# Patient Record
Sex: Female | Born: 1950 | Race: White | Hispanic: No | Marital: Married | Smoking: Never smoker
Health system: Southern US, Community
[De-identification: ages and names within clinical notes are randomized; demographics above are authoritative.]

## PROBLEM LIST (undated history)

## (undated) DIAGNOSIS — M1990 Unspecified osteoarthritis, unspecified site: Secondary | ICD-10-CM

## (undated) DIAGNOSIS — R030 Elevated blood-pressure reading, without diagnosis of hypertension: Secondary | ICD-10-CM

## (undated) HISTORY — DX: Elevated blood-pressure reading, without diagnosis of hypertension: R03.0

## (undated) HISTORY — DX: Unspecified osteoarthritis, unspecified site: M19.90

## (undated) HISTORY — PX: OTHER SURGICAL HISTORY: SHX169

---

## 1998-04-30 ENCOUNTER — Other Ambulatory Visit: Admission: RE | Admit: 1998-04-30 | Discharge: 1998-04-30 | Payer: Self-pay | Admitting: Obstetrics & Gynecology

## 2000-04-19 ENCOUNTER — Other Ambulatory Visit: Admission: RE | Admit: 2000-04-19 | Discharge: 2000-04-19 | Payer: Self-pay | Admitting: Obstetrics & Gynecology

## 2002-08-29 ENCOUNTER — Other Ambulatory Visit: Admission: RE | Admit: 2002-08-29 | Discharge: 2002-08-29 | Payer: Self-pay | Admitting: Gynecology

## 2003-11-26 ENCOUNTER — Other Ambulatory Visit: Admission: RE | Admit: 2003-11-26 | Discharge: 2003-11-26 | Payer: Self-pay | Admitting: Obstetrics & Gynecology

## 2004-08-13 ENCOUNTER — Ambulatory Visit (HOSPITAL_COMMUNITY): Admission: RE | Admit: 2004-08-13 | Discharge: 2004-08-13 | Payer: Self-pay | Admitting: Obstetrics & Gynecology

## 2005-06-02 ENCOUNTER — Other Ambulatory Visit: Admission: RE | Admit: 2005-06-02 | Discharge: 2005-06-02 | Payer: Self-pay | Admitting: Obstetrics & Gynecology

## 2009-03-21 ENCOUNTER — Encounter: Admission: RE | Admit: 2009-03-21 | Discharge: 2009-03-21 | Payer: Self-pay | Admitting: Obstetrics & Gynecology

## 2016-02-19 ENCOUNTER — Other Ambulatory Visit: Payer: Self-pay | Admitting: Obstetrics & Gynecology

## 2016-02-19 DIAGNOSIS — R928 Other abnormal and inconclusive findings on diagnostic imaging of breast: Secondary | ICD-10-CM

## 2016-02-26 ENCOUNTER — Ambulatory Visit
Admission: RE | Admit: 2016-02-26 | Discharge: 2016-02-26 | Disposition: A | Discharge status: Home / Self Care | Payer: BLUE CROSS/BLUE SHIELD | Source: Ambulatory Visit | Attending: Obstetrics & Gynecology | Admitting: Obstetrics & Gynecology

## 2016-02-26 DIAGNOSIS — R928 Other abnormal and inconclusive findings on diagnostic imaging of breast: Secondary | ICD-10-CM

## 2016-07-24 ENCOUNTER — Other Ambulatory Visit: Payer: Self-pay | Admitting: Obstetrics & Gynecology

## 2016-07-24 DIAGNOSIS — N6001 Solitary cyst of right breast: Secondary | ICD-10-CM

## 2016-08-27 ENCOUNTER — Ambulatory Visit
Admission: RE | Admit: 2016-08-27 | Discharge: 2016-08-27 | Disposition: A | Discharge status: Home / Self Care | Payer: BLUE CROSS/BLUE SHIELD | Source: Ambulatory Visit | Attending: Obstetrics & Gynecology | Admitting: Obstetrics & Gynecology

## 2016-08-27 DIAGNOSIS — N6001 Solitary cyst of right breast: Secondary | ICD-10-CM

## 2016-09-23 ENCOUNTER — Other Ambulatory Visit: Payer: Self-pay | Admitting: Internal Medicine

## 2016-09-23 ENCOUNTER — Ambulatory Visit
Admission: RE | Admit: 2016-09-23 | Discharge: 2016-09-23 | Disposition: A | Discharge status: Home / Self Care | Payer: BLUE CROSS/BLUE SHIELD | Source: Ambulatory Visit | Attending: Internal Medicine | Admitting: Internal Medicine

## 2016-09-23 DIAGNOSIS — R05 Cough: Secondary | ICD-10-CM

## 2016-09-23 DIAGNOSIS — R059 Cough, unspecified: Secondary | ICD-10-CM

## 2017-02-12 ENCOUNTER — Other Ambulatory Visit: Payer: Self-pay | Admitting: Obstetrics & Gynecology

## 2017-02-12 DIAGNOSIS — N63 Unspecified lump in unspecified breast: Secondary | ICD-10-CM

## 2017-02-15 ENCOUNTER — Other Ambulatory Visit: Payer: Self-pay | Admitting: Obstetrics & Gynecology

## 2017-02-15 DIAGNOSIS — E2839 Other primary ovarian failure: Secondary | ICD-10-CM

## 2017-03-10 ENCOUNTER — Ambulatory Visit
Admission: RE | Admit: 2017-03-10 | Discharge: 2017-03-10 | Disposition: A | Discharge status: Home / Self Care | Payer: BLUE CROSS/BLUE SHIELD | Source: Ambulatory Visit | Attending: Obstetrics & Gynecology | Admitting: Obstetrics & Gynecology

## 2017-03-10 ENCOUNTER — Encounter: Payer: Self-pay | Admitting: Radiology

## 2017-03-10 DIAGNOSIS — N63 Unspecified lump in unspecified breast: Secondary | ICD-10-CM

## 2017-03-10 DIAGNOSIS — E2839 Other primary ovarian failure: Secondary | ICD-10-CM

## 2017-11-11 ENCOUNTER — Encounter (HOSPITAL_COMMUNITY): Payer: Self-pay | Admitting: Emergency Medicine

## 2017-11-11 ENCOUNTER — Emergency Department (HOSPITAL_COMMUNITY): Payer: Medicare Other

## 2017-11-11 ENCOUNTER — Other Ambulatory Visit: Payer: Self-pay

## 2017-11-11 ENCOUNTER — Emergency Department (HOSPITAL_COMMUNITY)
Admission: EM | Admit: 2017-11-11 | Discharge: 2017-11-11 | Disposition: A | Discharge status: Home / Self Care | Payer: Medicare Other | Attending: Emergency Medicine | Admitting: Emergency Medicine

## 2017-11-11 DIAGNOSIS — R0789 Other chest pain: Secondary | ICD-10-CM | POA: Diagnosis not present

## 2017-11-11 DIAGNOSIS — R05 Cough: Secondary | ICD-10-CM | POA: Insufficient documentation

## 2017-11-11 DIAGNOSIS — Z8249 Family history of ischemic heart disease and other diseases of the circulatory system: Secondary | ICD-10-CM | POA: Diagnosis not present

## 2017-11-11 DIAGNOSIS — R079 Chest pain, unspecified: Secondary | ICD-10-CM

## 2017-11-11 LAB — BASIC METABOLIC PANEL
Anion gap: 12 (ref 5–15)
BUN: 12 mg/dL (ref 6–20)
CO2: 21 mmol/L — ABNORMAL LOW (ref 22–32)
Calcium: 9.1 mg/dL (ref 8.9–10.3)
Chloride: 108 mmol/L (ref 101–111)
Creatinine, Ser: 0.66 mg/dL (ref 0.44–1.00)
GFR calc Af Amer: >60 mL/min (ref >60)
GFR calc non Af Amer: >60 mL/min (ref >60)
Glucose, Bld: 125 mg/dL — ABNORMAL HIGH (ref 65–99)
Potassium: 4 mmol/L (ref 3.5–5.1)
Sodium: 141 mmol/L (ref 135–145)

## 2017-11-11 LAB — CBC
HCT: 39 % (ref 36.0–46.0)
Hemoglobin: 13 g/dL (ref 12.0–15.0)
MCH: 29.3 pg (ref 26.0–34.0)
MCHC: 33.3 g/dL (ref 30.0–36.0)
MCV: 88 fL (ref 78.0–100.0)
Platelets: 293 10*3/uL (ref 150–400)
RBC: 4.43 MIL/uL (ref 3.87–5.11)
RDW: 12.8 % (ref 11.5–15.5)
WBC: 8 10*3/uL (ref 4.0–10.5)

## 2017-11-11 LAB — I-STAT TROPONIN, ED
Troponin i, poc: 0 ng/mL (ref 0.00–0.08)
Troponin i, poc: 0 ng/mL (ref 0.00–0.08)

## 2017-11-11 LAB — D-DIMER, QUANTITATIVE: D-Dimer, Quant: <0.27 ug/mL-FEU (ref 0.00–0.50)

## 2017-11-11 NOTE — ED Provider Notes (Signed)
[facility] EMERGENCY DEPARTMENT Provider Note   CSN: [id number] Arrival date & time: 11/11/17  0750     History   Chief Complaint Chief Complaint  Patient presents with  . Chest Pain    HPI Melody Wilkins is a 67 y.o. female.  HPI   Had increased blood pressures, saw PCP yesterday regarding this and was planning on checks of blood pressure and follow up Sharp pains under right breast has been going on for weeks, sometimes radiates around to the right back, has noticed it with arm movements, sometimes turning or driving. Comes and goes.   Dull aching pain on left side of chest, off and on at night for a few weeks Initially had pain on arrival but not now.  Nothing makes left sided pain better or worse. When asked about walking making it worse, reports yes.  No shortness of breath, no nausea, no diaphoresis, no lightheadedness 2 nights ago had nausea but not correlated with pain No recent lifting If walking a lot will have leg swelling, noted last year was asymmetric but none recently. No recent trips or surgery Has had a cough for a while, was trying flonase, thought was post-nasal, chronic for years.  No hx of medical problems, dad had CHF in 44s  Had htn newly seen this week, but physician didn't want to start meds yet, wanted to monitor and decide.    History reviewed. No pertinent past medical history.  There are no active problems to display for this patient.   History reviewed. No pertinent surgical history.  OB History    No data available       Home Medications    Prior to Admission medications   Medication Sig Start Date End Date Taking? Authorizing Provider  ibuprofen (ADVIL,MOTRIN) 200 MG tablet Take 200 mg by mouth every 6 (six) hours as needed for moderate pain.   Yes [provider]  Multiple Vitamin (MULTIVITAMIN) tablet Take 1 tablet by mouth daily.   Yes [provider]    Family History No family history  on file.  Social History Social History   Tobacco Use  . Smoking status: Never Smoker  . Smokeless tobacco: Never Used  Substance Use Topics  . Alcohol use: Yes    Comment: occass  . Drug use: No     Allergies   Penicillins and Amoxicillin   Review of Systems Review of Systems  Constitutional: Negative for fever.  HENT: Negative for sore throat.   Eyes: Negative for visual disturbance.  Respiratory: Positive for cough. Negative for shortness of breath.   Cardiovascular: Positive for chest pain. Negative for leg swelling.  Gastrointestinal: Negative for abdominal pain, nausea and vomiting.  Genitourinary: Negative for difficulty urinating.  Musculoskeletal: Negative for back pain and neck pain.  Skin: Negative for rash.  Neurological: Negative for syncope and headaches.     Physical Exam Updated Vital Signs BP (!) 176/90 (BP Location: Right Arm)   Pulse (!) 101   Temp 98.3 F (36.8 C) (Oral)   Resp 16   Ht 5\' 8"  (1.727 m)   Wt 78.9 kg (174 lb)   SpO2 97%   BMI 26.46 kg/m   Physical Exam  Constitutional: She is oriented to person, place, and time. She appears well-developed and well-nourished. No distress.  HENT:  Head: Normocephalic and atraumatic.  Eyes: Conjunctivae and EOM are normal.  Neck: Normal range of motion.  Cardiovascular: Normal rate, regular rhythm, normal heart sounds and  intact distal pulses. Exam reveals no gallop and no friction rub.  No murmur heard. Pulmonary/Chest: Effort normal and breath sounds normal. No respiratory distress. She has no wheezes. She has no rales.  Abdominal: Soft. She exhibits no distension. There is no tenderness. There is no guarding.  Musculoskeletal: She exhibits no edema or tenderness.  Neurological: She is alert and oriented to person, place, and time.  Skin: Skin is warm and dry. No rash noted. She is not diaphoretic. No erythema.  Nursing note and vitals reviewed.    ED Treatments / Results  Labs (all  labs ordered are listed, but only abnormal results are displayed) Labs Reviewed  BASIC METABOLIC PANEL - Abnormal; Notable for the following components:      Result Value   CO2 21 (*)    Glucose, Bld 125 (*)    All other components within normal limits  CBC  D-DIMER, QUANTITATIVE (NOT AT [facility])  I-STAT TROPONIN, ED    EKG  EKG Interpretation  Date/Time:  Thursday November 11 2017 09:17:59 EST Ventricular Rate:  92 PR Interval:  144 QRS Duration: 99 QT Interval:  408 QTC Calculation: 505 R Axis:   -32 Text Interpretation:  Sinus rhythm Abnormal R-wave progression, early transition Left ventricular hypertrophy Borderline T abnormalities, anterior leads Prolonged QT interval No significant change since last tracing Confirmed by Gareth Morgan [phone]) on 11/11/2017 10:40:36 AM       Radiology Dg Chest 2 View  Result Date: 11/11/2017 CLINICAL DATA:  Intermittent right-sided chest pain over the last week. Some dizziness and nausea. EXAM: CHEST  2 VIEW COMPARISON:  09/23/2016 FINDINGS: Heart size is normal. Mediastinal shadows are normal. The lungs are clear. No bronchial thickening. No infiltrate, mass, effusion or collapse. Pulmonary vascularity is normal. No bony abnormality. IMPRESSION: Normal chest Electronically Signed   By: Nelson Chimes M.D.   On: 11/11/2017 09:12    Procedures Procedures (including critical care time)  Medications Ordered in ED Medications - No data to display   Initial Impression / Assessment and Plan / ED Course  I have reviewed the triage vital signs and the nursing notes.  Pertinent labs & imaging results that were available during my care of the patient were reviewed by me and considered in my medical decision making (see chart for details).     67 year old female with no significant history, recent elevated blood pressure without diagnosis of htn yet, presents with concern of chest pain. Differential diagnosis for chest pain includes pulmonary  embolus, dissection, pneumothorax, pneumonia, ACS, myocarditis, pericarditis.  EKG was done and evaluate by me and showed nonspecific changes and no signs of pericarditis. Chest x-ray was done and evaluated by me and radiology and showed no sign of pneumonia or pneumothorax. Patient is low risk Wells, ddimer negative and have low suspicion for PE.  Patient is HEART score of 3 and had delta troponins which were both negative. Given this evaluation, history and physical have low suspicion for pulmonary embolus, pneumonia, ACS, myocarditis, pericarditis, dissection.   Recommend follow up as outpatient with PCP and Cardiology, consider outpatient stress testing.  Discussed reasons to return in detail.  Blood pressures improved in ED.  Patient discharged in stable condition with understanding of reasons to return.    Final Clinical Impressions(s) / ED Diagnoses   Final diagnoses:  Chest pain, unspecified type    ED Discharge Orders    None       Gareth Morgan, MD 11/11/17 1742

## 2017-11-11 NOTE — ED Triage Notes (Signed)
Pt reports intermittent R CP over past week, reports intermittent dizziness and nausea, denies SOB.  Pt describes pain as "sharp" under R breast, dull over L chest.  Resp e/u at this time.

## 2017-11-16 ENCOUNTER — Other Ambulatory Visit: Payer: Self-pay | Admitting: Internal Medicine

## 2017-11-16 DIAGNOSIS — R9431 Abnormal electrocardiogram [ECG] [EKG]: Secondary | ICD-10-CM

## 2017-11-19 ENCOUNTER — Other Ambulatory Visit: Payer: Self-pay

## 2017-11-19 ENCOUNTER — Ambulatory Visit (HOSPITAL_COMMUNITY): Payer: Medicare Other | Attending: Internal Medicine

## 2017-11-19 DIAGNOSIS — R9431 Abnormal electrocardiogram [ECG] [EKG]: Secondary | ICD-10-CM | POA: Diagnosis not present

## 2017-11-19 DIAGNOSIS — I10 Essential (primary) hypertension: Secondary | ICD-10-CM | POA: Diagnosis not present

## 2017-11-19 DIAGNOSIS — Z8249 Family history of ischemic heart disease and other diseases of the circulatory system: Secondary | ICD-10-CM | POA: Insufficient documentation

## 2017-12-16 ENCOUNTER — Encounter: Payer: Self-pay | Admitting: Internal Medicine

## 2017-12-17 ENCOUNTER — Ambulatory Visit: Payer: Medicare Other | Admitting: Internal Medicine

## 2017-12-17 ENCOUNTER — Encounter: Payer: Self-pay | Admitting: Internal Medicine

## 2017-12-17 VITALS — BP 138/84 | HR 81 | Ht 68.0 in | Wt 179.4 lb

## 2017-12-17 DIAGNOSIS — I10 Essential (primary) hypertension: Secondary | ICD-10-CM

## 2017-12-17 DIAGNOSIS — R9431 Abnormal electrocardiogram [ECG] [EKG]: Secondary | ICD-10-CM

## 2017-12-17 DIAGNOSIS — E785 Hyperlipidemia, unspecified: Secondary | ICD-10-CM | POA: Diagnosis not present

## 2017-12-17 DIAGNOSIS — R0789 Other chest pain: Secondary | ICD-10-CM

## 2017-12-17 NOTE — Patient Instructions (Signed)
Medication Instructions:  Your physician recommends that you continue on your current medications as directed. Please refer to the Current Medication list given to you today.  -- If you need a refill on your cardiac medications before your next appointment, please call your pharmacy. --  Labwork: None ordered  Testing/Procedures: Your physician has requested that you have a stress echocardiogram. For further information please visit [email or web address]. Please follow instruction sheet as given.    Follow-Up: Your physician wants you to follow-up as needed with Dr. Saunders Revel.    Thank you for choosing [facility]!!    Any Other Special Instructions Will Be Listed Below (If Applicable).    DASH Eating Plan DASH stands for "Dietary Approaches to Stop Hypertension." The DASH eating plan is a healthy eating plan that has been shown to reduce high blood pressure (hypertension). It may also reduce your risk for type 2 diabetes, heart disease, and stroke. The DASH eating plan may also help with weight loss. What are tips for following this plan? General guidelines  Avoid eating more than 2,300 mg (milligrams) of salt (sodium) a day. If you have hypertension, you may need to reduce your sodium intake to 1,500 mg a day.  Limit alcohol intake to no more than 1 drink a day for nonpregnant women and 2 drinks a day for men. One drink equals 12 oz of beer, 5 oz of wine, or 1 oz of hard liquor.  Work with your health care provider to maintain a healthy body weight or to lose weight. Ask what an ideal weight is for you.  Get at least 30 minutes of exercise that causes your heart to beat faster (aerobic exercise) most days of the week. Activities may include walking, swimming, or biking.  Work with your health care provider or diet and nutrition specialist (dietitian) to adjust your eating plan to your individual calorie needs. Reading food labels  Check food labels for the amount of sodium per  serving. Choose foods with less than 5 percent of the Daily Value of sodium. Generally, foods with less than 300 mg of sodium per serving fit into this eating plan.  To find whole grains, look for the word "whole" as the first word in the ingredient list. Shopping  Buy products labeled as "low-sodium" or "no salt added."  Buy fresh foods. Avoid canned foods and premade or frozen meals. Cooking  Avoid adding salt when cooking. Use salt-free seasonings or herbs instead of table salt or sea salt. Check with your health care provider or pharmacist before using salt substitutes.  Do not fry foods. Cook foods using healthy methods such as baking, boiling, grilling, and broiling instead.  Cook with heart-healthy oils, such as olive, canola, soybean, or sunflower oil. Meal planning   Eat a balanced diet that includes: ? 5 or more servings of fruits and vegetables each day. At each meal, try to fill half of your plate with fruits and vegetables. ? Up to 6-8 servings of whole grains each day. ? Less than 6 oz of lean meat, poultry, or fish each day. A 3-oz serving of meat is about the same size as a deck of cards. One egg equals 1 oz. ? 2 servings of low-fat dairy each day. ? A serving of nuts, seeds, or beans 5 times each week. ? Heart-healthy fats. Healthy fats called Omega-3 fatty acids are found in foods such as flaxseeds and coldwater fish, like sardines, salmon, and mackerel.  Limit how much you eat of the  following: ? Canned or prepackaged foods. ? Food that is high in trans fat, such as fried foods. ? Food that is high in saturated fat, such as fatty meat. ? Sweets, desserts, sugary drinks, and other foods with added sugar. ? Full-fat dairy products.  Do not salt foods before eating.  Try to eat at least 2 vegetarian meals each week.  Eat more home-cooked food and less restaurant, buffet, and fast food.  When eating at a restaurant, ask that your food be prepared with less salt  or no salt, if possible. What foods are recommended? The items listed may not be a complete list. Talk with your dietitian about what dietary choices are best for you. Grains Whole-grain or whole-wheat bread. Whole-grain or whole-wheat pasta. Brown rice. [place] Morrow. Bulgur. Whole-grain and low-sodium cereals. Pita bread. Low-fat, low-sodium crackers. Whole-wheat flour tortillas. Vegetables Fresh or frozen vegetables (raw, steamed, roasted, or grilled). Low-sodium or reduced-sodium tomato and vegetable juice. Low-sodium or reduced-sodium tomato sauce and tomato paste. Low-sodium or reduced-sodium canned vegetables. Fruits All fresh, dried, or frozen fruit. Canned fruit in natural juice (without added sugar). Meat and other protein foods Skinless chicken or [place]. Ground chicken or [place]. Pork with fat trimmed off. Fish and seafood. Egg whites. Dried beans, peas, or lentils. Unsalted nuts, nut butters, and seeds. Unsalted canned beans. Lean cuts of beef with fat trimmed off. Low-sodium, lean deli meat. Dairy Low-fat (1%) or fat-free (skim) milk. Fat-free, low-fat, or reduced-fat cheeses. Nonfat, low-sodium ricotta or cottage cheese. Low-fat or nonfat yogurt. Low-fat, low-sodium cheese. Fats and oils Soft margarine without trans fats. Vegetable oil. Low-fat, reduced-fat, or light mayonnaise and salad dressings (reduced-sodium). Canola, safflower, olive, soybean, and sunflower oils. Avocado. Seasoning and other foods Herbs. Spices. Seasoning mixes without salt. Unsalted popcorn and pretzels. Fat-free sweets. What foods are not recommended? The items listed may not be a complete list. Talk with your dietitian about what dietary choices are best for you. Grains Baked goods made with fat, such as croissants, muffins, or some breads. Dry pasta or rice meal packs. Vegetables Creamed or fried vegetables. Vegetables in a cheese sauce. Regular canned vegetables (not low-sodium or reduced-sodium).  Regular canned tomato sauce and paste (not low-sodium or reduced-sodium). Regular tomato and vegetable juice (not low-sodium or reduced-sodium). Angie Fava. Olives. Fruits Canned fruit in a light or heavy syrup. Fried fruit. Fruit in cream or butter sauce. Meat and other protein foods Fatty cuts of meat. Ribs. Fried meat. Berniece Salines. Sausage. Bologna and other processed lunch meats. Salami. Fatback. Hotdogs. Bratwurst. Salted nuts and seeds. Canned beans with added salt. Canned or smoked fish. Whole eggs or egg yolks. Chicken or [place] with skin. Dairy Whole or 2% milk, cream, and half-and-half. Whole or full-fat cream cheese. Whole-fat or sweetened yogurt. Full-fat cheese. Nondairy creamers. Whipped toppings. Processed cheese and cheese spreads. Fats and oils Butter. Stick margarine. Lard. Shortening. Ghee. Bacon fat. Tropical oils, such as coconut, palm kernel, or palm oil. Seasoning and other foods Salted popcorn and pretzels. Onion salt, garlic salt, seasoned salt, table salt, and sea salt. Worcestershire sauce. Tartar sauce. Barbecue sauce. Teriyaki sauce. Soy sauce, including reduced-sodium. Steak sauce. Canned and packaged gravies. Fish sauce. Oyster sauce. Cocktail sauce. Horseradish that you find on the shelf. Ketchup. Mustard. Meat flavorings and tenderizers. Bouillon cubes. Hot sauce and Tabasco sauce. Premade or packaged marinades. Premade or packaged taco seasonings. Relishes. Regular salad dressings. Where to find more information:  National Heart, Lung, and [place]: https://wilson-eaton.com/  American Heart Association: www.heart.org  Summary  The DASH eating plan is a healthy eating plan that has been shown to reduce high blood pressure (hypertension). It may also reduce your risk for type 2 diabetes, heart disease, and stroke.  With the DASH eating plan, you should limit salt (sodium) intake to 2,300 mg a day. If you have hypertension, you may need to reduce your sodium intake to 1,500 mg  a day.  When on the DASH eating plan, aim to eat more fresh fruits and vegetables, whole grains, lean proteins, low-fat dairy, and heart-healthy fats.  Work with your health care provider or diet and nutrition specialist (dietitian) to adjust your eating plan to your individual calorie needs. This information is not intended to replace advice given to you by your health care provider. Make sure you discuss any questions you have with your health care provider. Document Released: 10/01/2011 Document Revised: 10/05/2016 Document Reviewed: 10/05/2016 Elsevier Interactive Patient Education  [employer].

## 2017-12-17 NOTE — Progress Notes (Signed)
New Outpatient Visit Date: 12/17/2017  Referring Provider: Lavone Orn, MD [address] [place] [address]  Chief Complaint: Chest pain  HPI:  Melody Wilkins is a 67 y.o. female who is being seen today for the evaluation of chest pain following ED visit last month at the request of Dr. Laurann Montana. She has a history of elevated blood pressure (though she does not carry the diagnosis of hypertension) and hyperlipidemia. She presented to the ED on 11/11/17 with pain rating from the left shoulder to the center of her chest. She notes that she had received the pneumonia vaccine in her left upper arm the previous day and had been sleeping on her left arm that night. She described the pain as an ache, which resolved over the course of the day. There were no accompanying symptoms. She also noted occasional brief sharp chest pains when turning and reaching behind her. The pain would last only a few seconds while she was doing the movement. EKG showed sinus rhythm with LVH and nonspecific ST/T changes. Labs, including troponin 2, were unrevealing.  Today, Melody Wilkins reports feeling well. She went for a long walk yesterday (45 minutes) to see if this would bring on any chest discomfort. It did not. She denies a history of prior heart problems. She denies shortness of breath, palpitations, lightheadedness, edema, orthopnea, and PND. She was referred for an echocardiogram by Dr. Laurann Montana last month, which was normal. She is never undergone ischemia testing.  --------------------------------------------------------------------------------------------------  Cardiovascular History & Procedures: Cardiovascular Problems:  Atypical chest pain  Risk Factors:  Elevated blood pressure, hyperlipidemia, and age greater than 5  Cath/PCI:  None  CV Surgery:  None  EP Procedures and Devices:  None  Non-Invasive Evaluation(s):  None  Recent CV Pertinent Labs: Lab Results  Component  Value Date   K 4.0 11/11/2017   BUN 12 11/11/2017   CREATININE 0.66 11/11/2017    --------------------------------------------------------------------------------------------------  Past Medical History:  Diagnosis Date  . DJD (degenerative joint disease)    RIGHT ANKLE  . Elevated blood pressure reading     Past Surgical History:  Procedure Laterality Date  . ANKLE BONE SPUR Right   . CESAREAN SECTION     x2    Current Meds  Medication Sig  . ibuprofen (ADVIL,MOTRIN) 200 MG tablet Take 200 mg by mouth every 6 (six) hours as needed for moderate pain.  . Multiple Vitamin (MULTIVITAMIN) tablet Take 1 tablet by mouth daily.    Allergies: Penicillins; Omeprazole; and Amoxicillin  Social History   Socioeconomic History  . Marital status: Married    Spouse name: Not on file  . Number of children: 2  . Years of education: Not on file  . Highest education level: Not on file  Social Needs  . Financial resource strain: Not on file  . Food insecurity - worry: Not on file  . Food insecurity - inability: Not on file  . Transportation needs - medical: Not on file  . Transportation needs - non-medical: Not on file  Occupational History  . Occupation: RETIRED  Tobacco Use  . Smoking status: Never Smoker  . Smokeless tobacco: Never Used  Substance and Sexual Activity  . Alcohol use: Yes    Alcohol/week: 0.6 oz    Types: 1 Glasses of wine per week  . Drug use: No  . Sexual activity: Not on file  Other Topics Concern  . Not on file  Social History Narrative  . Not on  file    Family History  Problem Relation Age of Onset  . Ovarian cancer Mother 66  . Hypertension Father 24  . Congestive Heart Failure Father   . Kidney failure Father   . Liver disease Father     Review of Systems: A 12-system review of systems was performed and was negative except as noted in the  HPI.  --------------------------------------------------------------------------------------------------  Physical Exam: BP 138/84   Pulse 81   Ht 5\' 8"  (1.727 m)   Wt 179 lb 6.4 oz (81.4 kg)   SpO2 96%   BMI 27.28 kg/m   General:  Overweight woman, seated comfortably in the exam room. HEENT: No conjunctival pallor or scleral icterus. Moist mucous membranes. OP clear. Neck: Supple without lymphadenopathy, thyromegaly, JVD, or HJR. No carotid bruit. Lungs: Normal work of breathing. Clear to auscultation bilaterally without wheezes or crackles. Heart: Regular rate and rhythm without murmurs, rubs, or gallops. Non-displaced PMI. Abd: Bowel sounds present. Soft, NT/ND without hepatosplenomegaly Ext: No lower extremity edema. Radial, PT, and DP pulses are 2+ bilaterally Skin: Warm and Wilkins without rash. Neuro: CNIII-XII intact. Strength and fine-touch sensation intact in upper and lower extremities bilaterally. Psych: Normal mood and affect.  EKG:  Normal sinus rhythm, left axis deviation, and nonspecific ST changes.  Lab Results  Component Value Date   WBC 8.0 11/11/2017   HGB 13.0 11/11/2017   HCT 39.0 11/11/2017   MCV 88.0 11/11/2017   PLT 293 11/11/2017    Lab Results  Component Value Date   NA 141 11/11/2017   K 4.0 11/11/2017   CL 108 11/11/2017   CO2 21 (L) 11/11/2017   BUN 12 11/11/2017   CREATININE 0.66 11/11/2017   GLUCOSE 125 (H) 11/11/2017    No results found for: CHOL, HDL, LDLCALC, LDLDIRECT, TRIG, CHOLHDL   --------------------------------------------------------------------------------------------------  ASSESSMENT AND PLAN: Atypical chest pain and abnormal EKG The character of Melody Wilkins pain is most consistent with musculoskeletal etiology. Limited workup in the ED last month was unrevealing. Echocardiogram was also normal. Her EKG today demonstrates nonspecific ST segment changes. Cardiac risk factors include hyperlipidemia, hypertension (multiple  blood pressure readings over the last few months have been elevated), and age. We have discussed further assessment options and have agreed to perform an exercise stress echocardiogram. If this is normal, further cardiac testing can be deferred and primary prevention continued.  Hypertension Blood pressure borderline elevated today. Prior readings at home as well as with Dr. Laurann Montana and Ms. Salmela' gynecologist, have been elevated. We discussed lifestyle modifications for treatment of her hypertension, including weight loss and sodium restriction. I have provided her with information about the DASH diet. I will defer continuing management to Dr. Laurann Montana.  Hyperlipidemia Lipid panel last month was notable for LDL of 150. We discussed lifestyle modification to help get her LDL less than 1:30. If there is evidence of CAD on aforementioned testing, statin therapy will need to be considered.  Follow-up: To be determined based on results of stress echocardiogram. If this study is normal, Melody Wilkins can follow-up on an as-needed basis.  Nelva Bush, MD 12/18/2017 1:32 PM

## 2017-12-18 ENCOUNTER — Encounter: Payer: Self-pay | Admitting: Internal Medicine

## 2017-12-18 DIAGNOSIS — R9431 Abnormal electrocardiogram [ECG] [EKG]: Secondary | ICD-10-CM | POA: Insufficient documentation

## 2017-12-18 DIAGNOSIS — I10 Essential (primary) hypertension: Secondary | ICD-10-CM | POA: Insufficient documentation

## 2017-12-18 DIAGNOSIS — E782 Mixed hyperlipidemia: Secondary | ICD-10-CM | POA: Insufficient documentation

## 2017-12-18 DIAGNOSIS — R0789 Other chest pain: Secondary | ICD-10-CM | POA: Insufficient documentation

## 2017-12-23 ENCOUNTER — Telehealth (HOSPITAL_COMMUNITY): Payer: Self-pay | Admitting: *Deleted

## 2017-12-23 NOTE — Telephone Encounter (Signed)
Patient given detailed instructions per Stress Test Requisition Sheet for test on 12/30/17 at 2:30.Patient Notified to arrive 30 minutes early, and that it is imperative to arrive on time for appointment to keep from having the test rescheduled.  Patient verbalized understanding. Veronia Beets

## 2017-12-30 ENCOUNTER — Ambulatory Visit (HOSPITAL_COMMUNITY): Payer: Medicare Other

## 2017-12-30 ENCOUNTER — Ambulatory Visit (HOSPITAL_COMMUNITY): Payer: Medicare Other | Attending: Cardiology

## 2017-12-30 DIAGNOSIS — I10 Essential (primary) hypertension: Secondary | ICD-10-CM | POA: Insufficient documentation

## 2017-12-30 DIAGNOSIS — Z8249 Family history of ischemic heart disease and other diseases of the circulatory system: Secondary | ICD-10-CM | POA: Insufficient documentation

## 2017-12-30 DIAGNOSIS — R0600 Dyspnea, unspecified: Secondary | ICD-10-CM | POA: Insufficient documentation

## 2017-12-30 DIAGNOSIS — R0789 Other chest pain: Secondary | ICD-10-CM | POA: Diagnosis not present

## 2017-12-31 ENCOUNTER — Telehealth: Payer: Self-pay

## 2017-12-31 NOTE — Telephone Encounter (Signed)
Spoke with patient and she is able to come to an appointment at the end of the month with Melody Wilkins

## 2018-01-19 NOTE — Progress Notes (Signed)
Cardiology Office Note    Date:  01/20/2018   ID:  Melody, Wilkins 1951-01-28, MRN [id number]  PCP:  Lavone Orn, MD  Cardiologist:  Dr. Saunders Revel   Chief Complaint:  Stress echo follow up  History of Present Illness:   Melody Wilkins is a 67 y.o. female with hx of elevated blood pressure without dx of HTN and HLD presents for follow up.   Recently seen by Dr. Saunders Revel for chest pain after ER visit. EKG non specific changes. Felt atypical chest pain with mostly consistent with MSK etiology. Following exercise stress echo was normal except hypertensive response.   Here today for follow up. No recurrent chest pain. BP elevated today. She is initially reluctant to start medications but finally aggred. She has started walking exercise without any chest pain or sob. The patient denies nausea, vomiting, fever, chest pain, palpitations, shortness of breath, orthopnea, PND, dizziness, syncope, cough, congestion, abdominal pain, hematochezia, melena, lower extremity edema.   Past Medical History:  Diagnosis Date  . DJD (degenerative joint disease)    RIGHT ANKLE  . Elevated blood pressure reading     Past Surgical History:  Procedure Laterality Date  . ANKLE BONE SPUR Right   . CESAREAN SECTION     x2    Current Medications: Prior to Admission medications   Medication Sig Start Date End Date Taking? Authorizing Provider  ibuprofen (ADVIL,MOTRIN) 200 MG tablet Take 200 mg by mouth every 6 (six) hours as needed for moderate pain.    [provider]  Multiple Vitamin (MULTIVITAMIN) tablet Take 1 tablet by mouth daily.    [provider]    Allergies:   Penicillins; Omeprazole; and Amoxicillin   Social History   Socioeconomic History  . Marital status: Married    Spouse name: Not on file  . Number of children: 2  . Years of education: Not on file  . Highest education level: Not on file  Occupational History  . Occupation: RETIRED  Social Needs  . Financial  resource strain: Not on file  . Food insecurity:    Worry: Not on file    Inability: Not on file  . Transportation needs:    Medical: Not on file    Non-medical: Not on file  Tobacco Use  . Smoking status: Never Smoker  . Smokeless tobacco: Never Used  Substance and Sexual Activity  . Alcohol use: Yes    Alcohol/week: 0.6 oz    Types: 1 Glasses of wine per week  . Drug use: No  . Sexual activity: Not on file  Lifestyle  . Physical activity:    Days per week: Not on file    Minutes per session: Not on file  . Stress: Not on file  Relationships  . Social connections:    Talks on phone: Not on file    Gets together: Not on file    Attends religious service: Not on file    Active member of club or organization: Not on file    Attends meetings of clubs or organizations: Not on file    Relationship status: Not on file  Other Topics Concern  . Not on file  Social History Narrative  . Not on file     Family History:  The patient's family history includes Congestive Heart Failure in her father; Hypertension (age of onset: 64) in her father; Kidney failure in her father; Liver disease in her father; Ovarian cancer (age of onset: 85) in her  mother.   ROS:   Please see the history of present illness.    ROS All other systems reviewed and are negative.   PHYSICAL EXAM:   VS:  BP (!) 148/92   Pulse 93   Ht 5\' 8"  (1.727 m)   Wt 178 lb (80.7 kg)   SpO2 95%   BMI 27.06 kg/m    GEN: Well nourished, well developed, in no acute distress  HEENT: normal  Neck: no JVD, carotid bruits, or masses Cardiac: RRR; no murmurs, rubs, or gallops,no edema  Respiratory:  clear to auscultation bilaterally, normal work of breathing GI: soft, nontender, nondistended, + BS MS: no deformity or atrophy  Skin: warm and dry, no rash Neuro:  Alert and Oriented x 3, Strength and sensation are intact Psych: euthymic mood, full affect  Wt Readings from Last 3 Encounters:  01/20/18 178 lb (80.7 kg)    12/17/17 179 lb 6.4 oz (81.4 kg)  11/11/17 174 lb (78.9 kg)      Studies/Labs Reviewed:   EKG:  EKG is not ordered today.    Recent Labs: 11/11/2017: BUN 12; Creatinine, Ser 0.66; Hemoglobin 13.0; Platelets 293; Potassium 4.0; Sodium 141   Lipid Panel No results found for: CHOL, TRIG, HDL, CHOLHDL, VLDL, LDLCALC, LDLDIRECT  Additional studies/ records that were reviewed today include:   Echocardiogram: 11/19/17 Study Conclusions  - Left ventricle: The cavity size was normal. Systolic function was   normal. The estimated ejection fraction was in the range of 60%   to 65%.  Stress echo 12/30/17 - Stress ECG conclusions: There were no stress arrhythmias or   conduction abnormalities. The stress ECG was negative for   ischemia. - Staged echo: There was no echocardiographic evidence for   stress-induced ischemia.   ASSESSMENT & PLAN:    1. Benign Essential Hypertension - Will start low dose coreg at 3.125mg  BID for elevated blood pressure and pulse in 90s. She will keep log of his blood pressure and give [place] a call in 2-3 weeks.   2. HLD - Not on any regimen. She is not interested in any medications. Will be managed by diet and exercise.   3. Atypical chest pain - No reoccurance  Medication Adjustments/Labs and Tests Ordered: Current medicines are reviewed at length with the patient today.  Concerns regarding medicines are outlined above.  Medication changes, Labs and Tests ordered today are listed in the Patient Instructions below. Patient Instructions  Your physician has recommended you make the following change in your medication:  START CARVEDILOL 3.125 MG TWICE DAILY   Your physician recommends that you schedule a follow-up appointment in:  [address] [facility]     Signed, Leanor Kail, PA  01/20/2018 9:50 AM    [address], [place], [place]  [address] Phone: [phone]; Fax: [phone]

## 2018-01-20 ENCOUNTER — Ambulatory Visit: Payer: Medicare Other | Admitting: Physician Assistant

## 2018-01-20 ENCOUNTER — Encounter: Payer: Self-pay | Admitting: Physician Assistant

## 2018-01-20 VITALS — BP 148/92 | HR 93 | Ht 68.0 in | Wt 178.0 lb

## 2018-01-20 DIAGNOSIS — R0789 Other chest pain: Secondary | ICD-10-CM | POA: Diagnosis not present

## 2018-01-20 DIAGNOSIS — E785 Hyperlipidemia, unspecified: Secondary | ICD-10-CM

## 2018-01-20 DIAGNOSIS — I10 Essential (primary) hypertension: Secondary | ICD-10-CM | POA: Diagnosis not present

## 2018-01-20 MED ORDER — CARVEDILOL 3.125 MG PO TABS: 3.1250 mg | ORAL_TABLET | Freq: Two times a day (BID) | ORAL | 3 refills | Status: DC

## 2018-01-20 NOTE — Patient Instructions (Signed)
Your physician has recommended you make the following change in your medication:  START CARVEDILOL 3.125 MG TWICE DAILY   Your physician recommends that you schedule a follow-up appointment in:  [place]

## 2018-06-02 ENCOUNTER — Other Ambulatory Visit: Payer: Self-pay | Admitting: Obstetrics & Gynecology

## 2018-06-02 DIAGNOSIS — Z1231 Encounter for screening mammogram for malignant neoplasm of breast: Secondary | ICD-10-CM

## 2018-07-04 ENCOUNTER — Ambulatory Visit
Admission: RE | Admit: 2018-07-04 | Discharge: 2018-07-04 | Disposition: A | Discharge status: Home / Self Care | Payer: Medicare Other | Source: Ambulatory Visit | Attending: Obstetrics & Gynecology | Admitting: Obstetrics & Gynecology

## 2018-07-04 DIAGNOSIS — Z1231 Encounter for screening mammogram for malignant neoplasm of breast: Secondary | ICD-10-CM

## 2018-07-21 ENCOUNTER — Ambulatory Visit: Payer: Medicare Other | Admitting: Internal Medicine

## 2018-09-29 ENCOUNTER — Ambulatory Visit: Payer: Medicare Other | Admitting: Internal Medicine

## 2018-09-29 ENCOUNTER — Encounter: Payer: Self-pay | Admitting: Internal Medicine

## 2018-09-29 VITALS — BP 132/80 | HR 76 | Ht 68.0 in | Wt 176.0 lb

## 2018-09-29 DIAGNOSIS — R0789 Other chest pain: Secondary | ICD-10-CM | POA: Diagnosis not present

## 2018-09-29 DIAGNOSIS — I10 Essential (primary) hypertension: Secondary | ICD-10-CM

## 2018-09-29 DIAGNOSIS — E782 Mixed hyperlipidemia: Secondary | ICD-10-CM | POA: Diagnosis not present

## 2018-09-29 NOTE — Patient Instructions (Signed)
Medication Instructions:  none If you need a refill on your cardiac medications before your next appointment, please call your pharmacy.   Lab work: none If you have labs (blood work) drawn today and your tests are completely normal, you will receive your results only by: . MyChart Message (if you have MyChart) OR . A paper copy in the mail If you have any lab test that is abnormal or we need to change your treatment, we will call you to review the results.  Testing/Procedures: none  Follow-Up: As NEEDED At [facility], you and your health needs are our priority.  As part of our continuing mission to provide you with exceptional heart care, we have created designated Provider Care Teams.  These Care Teams include your primary Cardiologist (physician) and Advanced Practice Providers (APPs -  Physician Assistants and Nurse Practitioners) who all work together to provide you with the care you need, when you need it. Any Other Special Instructions Will Be Listed Below (If Applicable).    

## 2018-09-29 NOTE — Progress Notes (Signed)
Follow-up Outpatient Visit Date: 09/29/2018  Primary Care Provider: Lavone Orn, MD [place] [place] [address]  Chief Complaint: Follow-up hypertension and chest pain  HPI:  Ms. Slimp is a 67 y.o. year-old female with history of hypertension and hyperlipidemia, who presents for follow-up of chest pain and elevated blood pressure.  I met her in February after an ED visit for chest pain.  Exercise stress echo was normal other than hypertensive blood pressure response.  She subsequently followed up with Robbie Lis, PA, in March, at which time she denied further chest pain.  Due to elevated blood pressure, carvedilol 3.125 mg BID was added.  Today, Ms. Ferrando reports that she is feeling well.  She is recovering from Mohs surgery adjacent to the left eye and has not been as active on account of this.  She has not had chest pain nor shortness of breath, palpitations, lightheadedness, or edema.  She is tolerating carvedilol well and notes that her home blood pressures are typically in the 120's-130's/70's-80's.  --------------------------------------------------------------------------------------------------  Cardiovascular History & Procedures: Cardiovascular Problems:  Atypical chest pain  Risk Factors:  Hypertension hyperlipidemia, and age greater than 26  Cath/PCI:  None  CV Surgery:  None  EP Procedures and Devices:  None  Non-Invasive Evaluation(s):  Exercise stress echocardiogram (12/30/2017): No echocardiographic evidence of stress-induced ischemia.  Patient exercised for 4 minutes, 35 seconds, reaching 6.5 METS.  Transthoracic echocardiogram (11/19/2017): Normal LV size.  LVEF 60 to 65%.  Normal RV size and function.  No significant valvular abnormality.  Recent CV Pertinent Labs: Lab Results  Component Value Date   K 4.0 11/11/2017   BUN 12 11/11/2017   CREATININE 0.66 11/11/2017     Current Meds  Medication Sig  . carvedilol (COREG)  3.125 MG tablet Take 1 tablet (3.125 mg total) by mouth 2 (two) times daily.  . fluticasone (FLONASE) 50 MCG/ACT nasal spray Place into both nostrils daily as needed for allergies.  . Multiple Vitamin (MULTIVITAMIN) tablet Take 1 tablet by mouth daily.    Allergies: Penicillins; Omeprazole; and Amoxicillin  Social History   Tobacco Use  . Smoking status: Never Smoker  . Smokeless tobacco: Never Used  Substance Use Topics  . Alcohol use: Yes    Alcohol/week: 1.0 standard drinks    Types: 1 Glasses of wine per week  . Drug use: No    Family History  Problem Relation Age of Onset  . Ovarian cancer Mother 74  . Hypertension Father 59  . Congestive Heart Failure Father   . Kidney failure Father   . Liver disease Father     Review of Systems: A 12-system review of systems was performed and was negative except as noted in the HPI.  --------------------------------------------------------------------------------------------------  Physical Exam: BP 132/80   Pulse 76   Ht '5\' 8"'  (1.727 m)   Wt 176 lb (79.8 kg)   SpO2 99%   BMI 26.76 kg/m   General: NAD. HEENT: No conjunctival pallor or scleral icterus. Moist mucous membranes.  OP clear.  Dressing overlying left eyebrow. Neck: Supple without lymphadenopathy, thyromegaly, JVD, or HJR.  Lungs: Normal work of breathing. Clear to auscultation bilaterally without wheezes or crackles. Heart: Regular rate and rhythm without murmurs, rubs, or gallops. Non-displaced PMI. Abd: Bowel sounds present. Soft, NT/ND without hepatosplenomegaly Ext: No lower extremity edema. Radial, PT, and DP pulses are 2+ bilaterally. Skin: Warm and dry without rash.  EKG: Normal sinus rhythm with left axis deviation, borderline LVH,  and nonspecific ST segment changes.  No significant change from prior tracing on 12/17/2017.  Lab Results  Component Value Date   WBC 8.0 11/11/2017   HGB 13.0 11/11/2017   HCT 39.0 11/11/2017   MCV 88.0 11/11/2017   PLT  293 11/11/2017    Lab Results  Component Value Date   NA 141 11/11/2017   K 4.0 11/11/2017   CL 108 11/11/2017   CO2 21 (L) 11/11/2017   BUN 12 11/11/2017   CREATININE 0.66 11/11/2017   GLUCOSE 125 (H) 11/11/2017    No results found for: CHOL, HDL, LDLCALC, LDLDIRECT, TRIG, CHOLHDL  --------------------------------------------------------------------------------------------------  ASSESSMENT AND PLAN: Atypical chest pain No recurrence since her last visit.  Stress echocardiogram was without evidence of ischemia, though reduced exercise capacity was noted.  I have encouraged Ms. Sedler to restart exercising when able to following her Mohs surgery.  No further work-up is recommended at this time.  Hypertension Blood pressure upper normal today.  We will continue carvedilol 3.125 mg twice daily.  Sodium restriction and exercise encouraged.  Hyperlipidemia LDL noted to be 150 in January.  Patient should continue with lifestyle modification and consider addition of a statin if LDL does not fall below 130.  I will defer continued management to Dr. Laurann Montana.  Follow-up: Return to clinic as needed.  Nelva Bush, MD 09/29/2018 12:12 PM

## 2018-11-22 ENCOUNTER — Ambulatory Visit: Payer: Medicare Other | Admitting: Allergy & Immunology

## 2018-11-22 ENCOUNTER — Encounter: Payer: Self-pay | Admitting: Allergy & Immunology

## 2018-11-22 VITALS — BP 142/84 | HR 77 | Temp 98.9°F | Ht 68.0 in | Wt 189.0 lb

## 2018-11-22 DIAGNOSIS — R05 Cough: Secondary | ICD-10-CM

## 2018-11-22 DIAGNOSIS — J310 Chronic rhinitis: Secondary | ICD-10-CM

## 2018-11-22 DIAGNOSIS — R059 Cough, unspecified: Secondary | ICD-10-CM

## 2018-11-22 DIAGNOSIS — K219 Gastro-esophageal reflux disease without esophagitis: Secondary | ICD-10-CM

## 2018-11-22 MED ORDER — OMEPRAZOLE 40 MG PO CPDR: 40.0000 mg | DELAYED_RELEASE_CAPSULE | Freq: Every morning | ORAL | 5 refills | Status: DC

## 2018-11-22 MED ORDER — FAMOTIDINE 40 MG PO TABS: 40.0000 mg | ORAL_TABLET | Freq: Every day | ORAL | 5 refills | Status: DC

## 2018-11-22 NOTE — Patient Instructions (Addendum)
1. Non-allergic rhinitis - Testing was negative to the entire panel. - We did not do the next step (intradermal testing) since your story did not sound allergic at all.  - Sample of Nasacort provided to try (one spray per nostril on Mon/Wed/Fri) to see if this helps with any coexisting postnasal drip.   2. Gastroesophageal reflux disease - With the timing of the symptoms, I think that reflux could be contributing to your cough. - We will give reflux medications a try for a couple of months to see if this helps. - Start omeprazole 40mg  in the morning and famotidine 40mg  at night. - Reflux precautions provided below.   3. Cough - Spirometry did not look normal, with values in the 50% range for age and gender. - There was minimal improvement with the nebulizer treatment.  4. Return in about 6 weeks (around 01/03/2019).   Please inform us of any Emergency Department visits, hospitalizations, or changes in symptoms. Call us before going to the ED for breathing or allergy symptoms since we might be able to fit you in for a sick visit. Feel free to contact us anytime with any questions, problems, or concerns.  It was a pleasure to meet you today!  Websites that have reliable patient information: 1. American Academy of Asthma, Allergy, and Immunology: www.aaaai.org 2. Food Allergy Research and Education (FARE): foodallergy.org 3. Mothers of Asthmatics: http://www.asthmacommunitynetwork.org 4. American College of Allergy, Asthma, and Immunology: MonthlyElectricBill.co.uk   Make sure you are registered to vote! If you have moved or changed any of your contact information, you will need to get this updated before voting!    Voter ID laws are going into effect for the General Election in November 2020! Be prepared! Check out http://levine.com/ for more details.      Lifestyle Changes for Controlling GERD  When you have GERD, stomach acid feels as if it's backing up toward your  mouth. Whether or not you take medication to control your GERD, your symptoms can often be improved with lifestyle changes.   Raise Your Head  Reflux is more likely to strike when you're lying down flat, because stomach fluid can  flow backward more easily. Raising the head of your bed 4-6 inches can help. To do this:  Slide blocks or books under the legs at the head of your bed. Or, place a wedge under  the mattress. Many foam stores can make a suitable wedge for you. The wedge  should run from your waist to the top of your head.  Don't just prop your head on several pillows. This increases pressure on your  stomach. It can make GERD worse.  Watch Your Eating Habits Certain foods may increase the acid in your stomach or relax the lower esophageal sphincter, making GERD more likely. It's best to avoid the following:  Coffee, tea, and carbonated drinks (with and without caffeine)  Fatty, fried, or spicy food  Mint, chocolate, onions, and tomatoes  Any other foods that seem to irritate your stomach or cause you pain  Relieve the Pressure  Eat smaller meals, even if you have to eat more often.  Don't lie down right after you eat. Wait a few hours for your stomach to empty.  Avoid tight belts and tight-fitting clothes.  Lose excess weight.  Tobacco and Alcohol  Avoid smoking tobacco and drinking alcohol. They can make GERD symptoms worse.

## 2018-11-23 NOTE — Progress Notes (Signed)
NEW PATIENT  Date of Service/Encounter:  11/22/18  Referring provider: Lavone Orn, MD   Assessment:   Cough - ? Restrictive disease on spirometry  Non-allergic rhinitis  Possible gastroesophageal reflux disease   [name] presents for an evaluation of cough that is lasted for 2 years.  Her symptoms are worse in the evening and at night, pointing towards reflux as a cause of her symptoms.  Although she denies any traditional heartburn symptoms, we are going to start her on acid suppression to see if this helps at all.  She had no allergic triggers noted on her skin testing today, and in the absence of other atopic disease, I do not think intradermal would have added much to her diagnostic work-up.  We are going to start Nasacort 3 times a week to see if this will help control any postnasal drip.  Her spirometry was quite terrible today, but it seemed that her effort was not great either.  She had no response to an albuterol nebulizer, so I am not going to go down the asthma diagnosis at this point.  We can certainly consider this in the future.  If her spirometry continues to look terrible at the next visit, we will refer her for full pulmonary function testing.   Plan/Recommendations:   1. Non-allergic rhinitis - Testing was negative to the entire panel. - We did not do the next step (intradermal testing) since your story did not sound allergic at all.  - Sample of Nasacort provided to try (one spray per nostril on Mon/Wed/Fri) to see if this helps with any coexisting postnasal drip.   2. Gastroesophageal reflux disease - With the timing of the symptoms, I think that reflux could be contributing to your cough. - We will give reflux medications a try for a couple of months to see if this helps. - Start omeprazole 40mg  in the morning and famotidine 40mg  at night. - Reflux precautions provided below.   3. Cough - Spirometry did not look normal, with values in the 50% range for  age and gender. - There was minimal improvement with the nebulizer treatment.  4. Return in about 6 weeks (around 01/03/2019).  Subjective:   Melody Wilkins is a 68 y.o. female presenting today for evaluation of  Chief Complaint  Patient presents with  . Cough    [name] has a history of the following: Patient Active Problem List   Diagnosis Date Noted  . Atypical chest pain 12/18/2017  . Abnormal EKG 12/18/2017  . Essential hypertension 12/18/2017  . Mixed hyperlipidemia 12/18/2017    History obtained from: chart review and patient.  [name] was referred by Lavone Orn, MD.     Melody Wilkins is a 68 y.o. female presenting for an evaluation of a chronic cough. She has had a chronic cough for two years. This is typically in the evening and at night. She does report some "rattling and wheezing" that wakes her husband. This has been managed by her PCP. She has had multiple steroids and antibiotics and a CXR. This was around one year ago. January CXR was completely normal. Steroids did help with the cough, she thinks, although the history is rather vague.   She has never had an inhaler and has never had a breathing test. She does report some postnasal drip. She has tried some nose sprays. Flonase did provide a bit of improvement, but she has had some eye dryness. She denies any ear problems. She  denies a runny nose. She does not have any sneezing.   She does endorse problems breathing with certain scents such as cleaning product. She has never needed to go to the ED for these symptoms. The periods last a couple of minutes before they clear up. She did have air testing and mold testing that has been negative. She has never been to see ENT or Pulmonology.   She does not think that she has reflux at all. She denies pain and burning, which is why she has not started any kind of acid suppression.    Otherwise, there is no history of other atopic diseases, including food allergies,  drug allergies, stinging insect allergies, eczema or urticaria. There is no significant infectious history. Vaccinations are up to date.    Past Medical History: Patient Active Problem List   Diagnosis Date Noted  . Atypical chest pain 12/18/2017  . Abnormal EKG 12/18/2017  . Essential hypertension 12/18/2017  . Mixed hyperlipidemia 12/18/2017    Medication List:  Allergies as of 11/22/2018      Reactions   Penicillins Hives, Rash   Has patient had a PCN reaction causing immediate rash, facial/tongue/throat swelling, SOB or lightheadedness with hypotension: Yes Has patient had a PCN reaction causing severe rash involving mucus membranes or skin necrosis: Yes Has patient had a PCN reaction that required hospitalization: No Has patient had a PCN reaction occurring within the last 10 years: No If all of the above answers are "NO", then may proceed with Cephalosporin use.   Omeprazole Diarrhea   Amoxicillin Hives, Rash      Medication List       Accurate as of November 22, 2018  5:51 PM. Always use your most recent med list.        carvedilol 3.125 MG tablet Commonly known as:  COREG Take 1 tablet (3.125 mg total) by mouth 2 (two) times daily.   famotidine 40 MG tablet Commonly known as:  PEPCID Take 1 tablet (40 mg total) by mouth at bedtime.   multivitamin tablet Take 1 tablet by mouth daily.   omeprazole 40 MG capsule Commonly known as:  PRILOSEC Take 1 capsule (40 mg total) by mouth every morning.       Birth History: non-contributory  Developmental History: non-contributory.   Past Surgical History: Past Surgical History:  Procedure Laterality Date  . ANKLE BONE SPUR Right   . CESAREAN SECTION     x2     Family History: Family History  Problem Relation Age of Onset  . Ovarian cancer Mother 31  . Hypertension Father 42  . Congestive Heart Failure Father   . Kidney failure Father   . Liver disease Father      Social History: Melody Wilkins lives at home  with husband.  She is a retired Futures trader.  There are no pets inside of the home.  She has never been a smoker.  They have central air conditioning and gas for heating.  She does have dust mite covers on her bedding.    Review of Systems: a 14-point review of systems is pertinent for what is mentioned in HPI.  Otherwise, all other systems were negative. Constitutional: negative other than that listed in the HPI Eyes: negative other than that listed in the HPI Ears, nose, mouth, throat, and face: negative other than that listed in the HPI Respiratory: negative other than that listed in the HPI Cardiovascular: negative other than that listed in the HPI Gastrointestinal: negative other than  that listed in the HPI Genitourinary: negative other than that listed in the HPI Integument: negative other than that listed in the HPI Hematologic: negative other than that listed in the HPI Musculoskeletal: negative other than that listed in the HPI Neurological: negative other than that listed in the HPI Allergy/Immunologic: negative other than that listed in the HPI    Objective:   Blood pressure (!) 142/84, pulse 77, temperature 98.9 F (37.2 C), temperature source Oral, height 5\' 8"  (1.727 m), weight 189 lb (85.7 kg), SpO2 95 %. Body mass index is 28.74 kg/m.   Physical Exam:  General: Alert, interactive, in no acute distress. Pleasant female.  Eyes: No conjunctival injection bilaterally, no discharge on the right, no discharge on the left and no Horner-Trantas dots present. PERRL bilaterally. EOMI without pain. No photophobia.  Ears: Right TM pearly gray with normal light reflex, Left TM pearly gray with normal light reflex, Right TM intact without perforation and Left TM intact without perforation.  Nose/Throat: External nose within normal limits and septum midline. Turbinates edematous without discharge. Posterior oropharynx mildly erythematous without cobblestoning in the posterior  oropharynx. Tonsils 2+ without exudates.  Tongue without thrush. Neck: Supple without thyromegaly. Trachea midline. Adenopathy: no enlarged lymph nodes appreciated in the anterior cervical, occipital, axillary, epitrochlear, inguinal, or popliteal regions. Lungs: Clear to auscultation without wheezing, rhonchi or rales. No increased work of breathing. CV: Normal S1/S2. No murmurs. Capillary refill <2 seconds.  Abdomen: Nondistended, nontender. No guarding or rebound tenderness. Bowel sounds present in all fields and hypoactive  Skin: Warm and dry, without lesions or rashes. Extremities:  No clubbing, cyanosis or edema. Neuro:   Grossly intact. No focal deficits appreciated. Responsive to questions.  Diagnostic studies:   Spirometry: results abnormal (FEV1: 1.41/51%, FVC: 1.65/45%, FEV1/FVC: 85%).    Spirometry consistent with possible restrictive disease. Xopenex/Atrovent nebulizer treatment given in clinic with no improvement.  Allergy Studies: negative to the entire environmental panel with adequate controls    Allergy testing results were read and interpreted by myself, documented by clinical staff.       Salvatore Marvel, MD [facility]

## 2019-01-03 ENCOUNTER — Encounter: Payer: Self-pay | Admitting: Allergy & Immunology

## 2019-01-03 ENCOUNTER — Ambulatory Visit: Payer: Medicare Other | Admitting: Allergy & Immunology

## 2019-01-03 VITALS — BP 116/78 | HR 77 | Temp 98.0°F | Resp 18 | Ht 66.4 in | Wt 181.8 lb

## 2019-01-03 DIAGNOSIS — R05 Cough: Secondary | ICD-10-CM

## 2019-01-03 DIAGNOSIS — R059 Cough, unspecified: Secondary | ICD-10-CM

## 2019-01-03 DIAGNOSIS — J310 Chronic rhinitis: Secondary | ICD-10-CM | POA: Diagnosis not present

## 2019-01-03 DIAGNOSIS — K219 Gastro-esophageal reflux disease without esophagitis: Secondary | ICD-10-CM | POA: Diagnosis not present

## 2019-01-03 MED ORDER — BUDESONIDE-FORMOTEROL FUMARATE 160-4.5 MCG/ACT IN AERO: 2.0000 | INHALATION_SPRAY | Freq: Two times a day (BID) | RESPIRATORY_TRACT | 5 refills | Status: DC

## 2019-01-03 NOTE — Progress Notes (Signed)
FOLLOW UP  Date of Service/Encounter:  01/03/19   Assessment:   Cough - ? restrictive disease on spirometry  Non-allergic rhinitis  Possible gastroesophageal reflux disease   I think that Melody Wilkins needs a full pulmonary function testing performed to evaluate her restrictive lung disease. Unfortunately she does not have the time for this given her husband's current medical condition. Therefore we will go ahead and empirically treat for asthma with Symbicort. She will email me with an update in one month on initiating this.   Plan/Recommendations:   1. Non-allergic rhinitis - Continue with Nasacort one spray per nostril on Mon/Wed/Fri to help with coexisting postnasal drip.   2. Gastroesophageal reflux disease - Continue with omeprazole 40mg  in the morning and famotidine 40mg  at night. - This is providing some relief, so I would feel better continuing for now.   3. Cough - Spirometry was slightly better, but not normal. - We are going to start you on an inhaler to see if this provides any relief at all. - Email me with an update in one month: [email or web address] - Spacer sample and demonstration provided. - Daily controller medication(s): Symbicort 160/4.78mcg two puffs twice daily with spacer - Prior to physical activity: albuterol 2 puffs 10-15 minutes before physical activity. - Rescue medications: albuterol 4 puffs every 4-6 hours as needed - Asthma control goals:  * Full participation in all desired activities (may need albuterol before activity) * Albuterol use two time or less a week on average (not counting use with activity) * Cough interfering with sleep two time or less a month * Oral steroids no more than once a year * No hospitalizations  4. Return in about 4 months (around 05/05/2019).  Subjective:   Melody Wilkins is a 68 y.o. female presenting today for follow up of  Chief Complaint  Patient presents with  . Follow-up    Small cough but  much better  . Gastroesophageal Reflux    Better    Melody Wilkins has a history of the following: Patient Active Problem List   Diagnosis Date Noted  . Atypical chest pain 12/18/2017  . Abnormal EKG 12/18/2017  . Essential hypertension 12/18/2017  . Mixed hyperlipidemia 12/18/2017    History obtained from: chart review and patient.  Melody Wilkins is a 68 y.o. female presenting for a follow up visit.  She was last seen in January 2020 for evaluation of a cough.  Cough was worse in the evening and at night, which pointed towards reflux as a cause of her symptoms.  She had no allergic triggers on skin testing.  We did start heartburn medication as well as Nasacort to see if we get help with the GERD and postnasal drip, respectively.  Her full pulmonary function testing was not normal with values in the 50% range; there was minimal improvement with a nebulizer treatment.  Since the last visit, she tells me that she continues to have a coughing sporadically. Her friends say that she coughs all of the time but this is not true. She does quite coughing once she settles down and goes to sleep. Overall she feels that her symptoms are well controlled but clearly not completely resolved. She has never needed albuterol or other inhalers at all.   While she had no problems with classic GERD symptoms prior to the last appointment, she does feel that the addition of her reflux medications have helped even more.   Otherwise, there have been no changes to her past  medical history, surgical history, family history, or social history.    Review of Systems  Constitutional: Negative.  Negative for fever, malaise/fatigue and weight loss.  HENT: Negative.  Negative for congestion, ear discharge and ear pain.   Eyes: Negative for pain, discharge and redness.  Respiratory: Negative for cough, sputum production, shortness of breath and wheezing.   Cardiovascular: Negative.  Negative for chest pain and palpitations.    Gastrointestinal: Negative for abdominal pain and heartburn.  Skin: Negative.  Negative for itching and rash.  Neurological: Negative for dizziness and headaches.  Endo/Heme/Allergies: Negative for environmental allergies. Does not bruise/bleed easily.       Objective:   Blood pressure 116/78, pulse 77, temperature 98 F (36.7 C), temperature source Oral, resp. rate 18, height 5' 6.4" (1.687 m), weight 181 lb 12.8 oz (82.5 kg), SpO2 96 %. Body mass index is 28.99 kg/m.   Physical Exam:  Physical Exam  Constitutional: She appears well-developed.  HENT:  Head: Normocephalic and atraumatic.  Right Ear: Tympanic membrane, external ear and ear canal normal.  Left Ear: Tympanic membrane and ear canal normal.  Nose: No mucosal edema, rhinorrhea, nasal deformity or septal deviation. No epistaxis. Right sinus exhibits no maxillary sinus tenderness and no frontal sinus tenderness. Left sinus exhibits no maxillary sinus tenderness and no frontal sinus tenderness.  Mouth/Throat: Uvula is midline and oropharynx is clear and moist. Mucous membranes are not pale and not dry.  She does have some some rhinorrhea present but minimal cobblestoning.   Eyes: Pupils are equal, round, and reactive to light. Conjunctivae and EOM are normal. Right eye exhibits no chemosis and no discharge. Left eye exhibits no chemosis and no discharge. Right conjunctiva is not injected. Left conjunctiva is not injected.  Cardiovascular: Normal rate, regular rhythm and normal heart sounds.  Respiratory: Effort normal and breath sounds normal. No accessory muscle usage. No tachypnea. No respiratory distress. She has no wheezes. She has no rhonchi. She has no rales. She exhibits no tenderness.  Moving air well in all lung fields.   Lymphadenopathy:    She has no cervical adenopathy.  Neurological: She is alert.  Skin: No abrasion, no petechiae and no rash noted. Rash is not papular, not vesicular and not urticarial. No  erythema. No pallor.  Psychiatric: She has a normal mood and affect.     Diagnostic studies:    Spirometry: results abnormal (FEV1: 1.60/60%, FVC: 1.80/51%, FEV1/FVC: 89%).    Spirometry consistent with possible restrictive disease. Overall her values are slightly improved compared to her last values obtained at the last visit.   Allergy Studies: none       Salvatore Marvel, MD  [facility]

## 2019-01-03 NOTE — Patient Instructions (Addendum)
1. Non-allergic rhinitis - Continue with Nasacort one spray per nostril on Mon/Wed/Fri to help with coexisting postnasal drip.   2. Gastroesophageal reflux disease - Continue with omeprazole 40mg  in the morning and famotidine 40mg  at night. - This is providing some relief, so I would feel better continuing for now.   3. Cough - Spirometry was slightly better, but not normal. - We are going to start you on an inhaler to see if this provides any relief at all. - Email me with an update in one month: [email or web address] - Spacer sample and demonstration provided. - Daily controller medication(s): Symbicort 160/4.40mcg two puffs twice daily with spacer - Prior to physical activity: albuterol 2 puffs 10-15 minutes before physical activity. - Rescue medications: albuterol 4 puffs every 4-6 hours as needed - Asthma control goals:  * Full participation in all desired activities (may need albuterol before activity) * Albuterol use two time or less a week on average (not counting use with activity) * Cough interfering with sleep two time or less a month * Oral steroids no more than once a year * No hospitalizations  4. Return in about 4 months (around 05/05/2019).   Please inform us of any Emergency Department visits, hospitalizations, or changes in symptoms. Call us before going to the ED for breathing or allergy symptoms since we might be able to fit you in for a sick visit. Feel free to contact us anytime with any questions, problems, or concerns.  It was a pleasure to see you again today!  Websites that have reliable patient information: 1. American Academy of Asthma, Allergy, and Immunology: www.aaaai.org 2. Food Allergy Research and Education (FARE): foodallergy.org 3. Mothers of Asthmatics: http://www.asthmacommunitynetwork.org 4. American College of Allergy, Asthma, and Immunology: MonthlyElectricBill.co.uk   Make sure you are registered to vote! If you have moved or changed any of your  contact information, you will need to get this updated before voting!    Voter ID laws are NOT going into effect for the General Election in November 2020! DO NOT let this stop you from exercising your right to vote!

## 2019-02-15 ENCOUNTER — Other Ambulatory Visit: Payer: Self-pay | Admitting: Physician Assistant

## 2019-03-07 IMAGING — CR DG CHEST 2V
2 series · 2 of 2 positions shown · non-contrast
Comparison: 09/23/2016

CLINICAL DATA: Intermittent right-sided chest pain over the last
week. Some dizziness and nausea.

EXAM:
CHEST  2 VIEW

[chest pa]
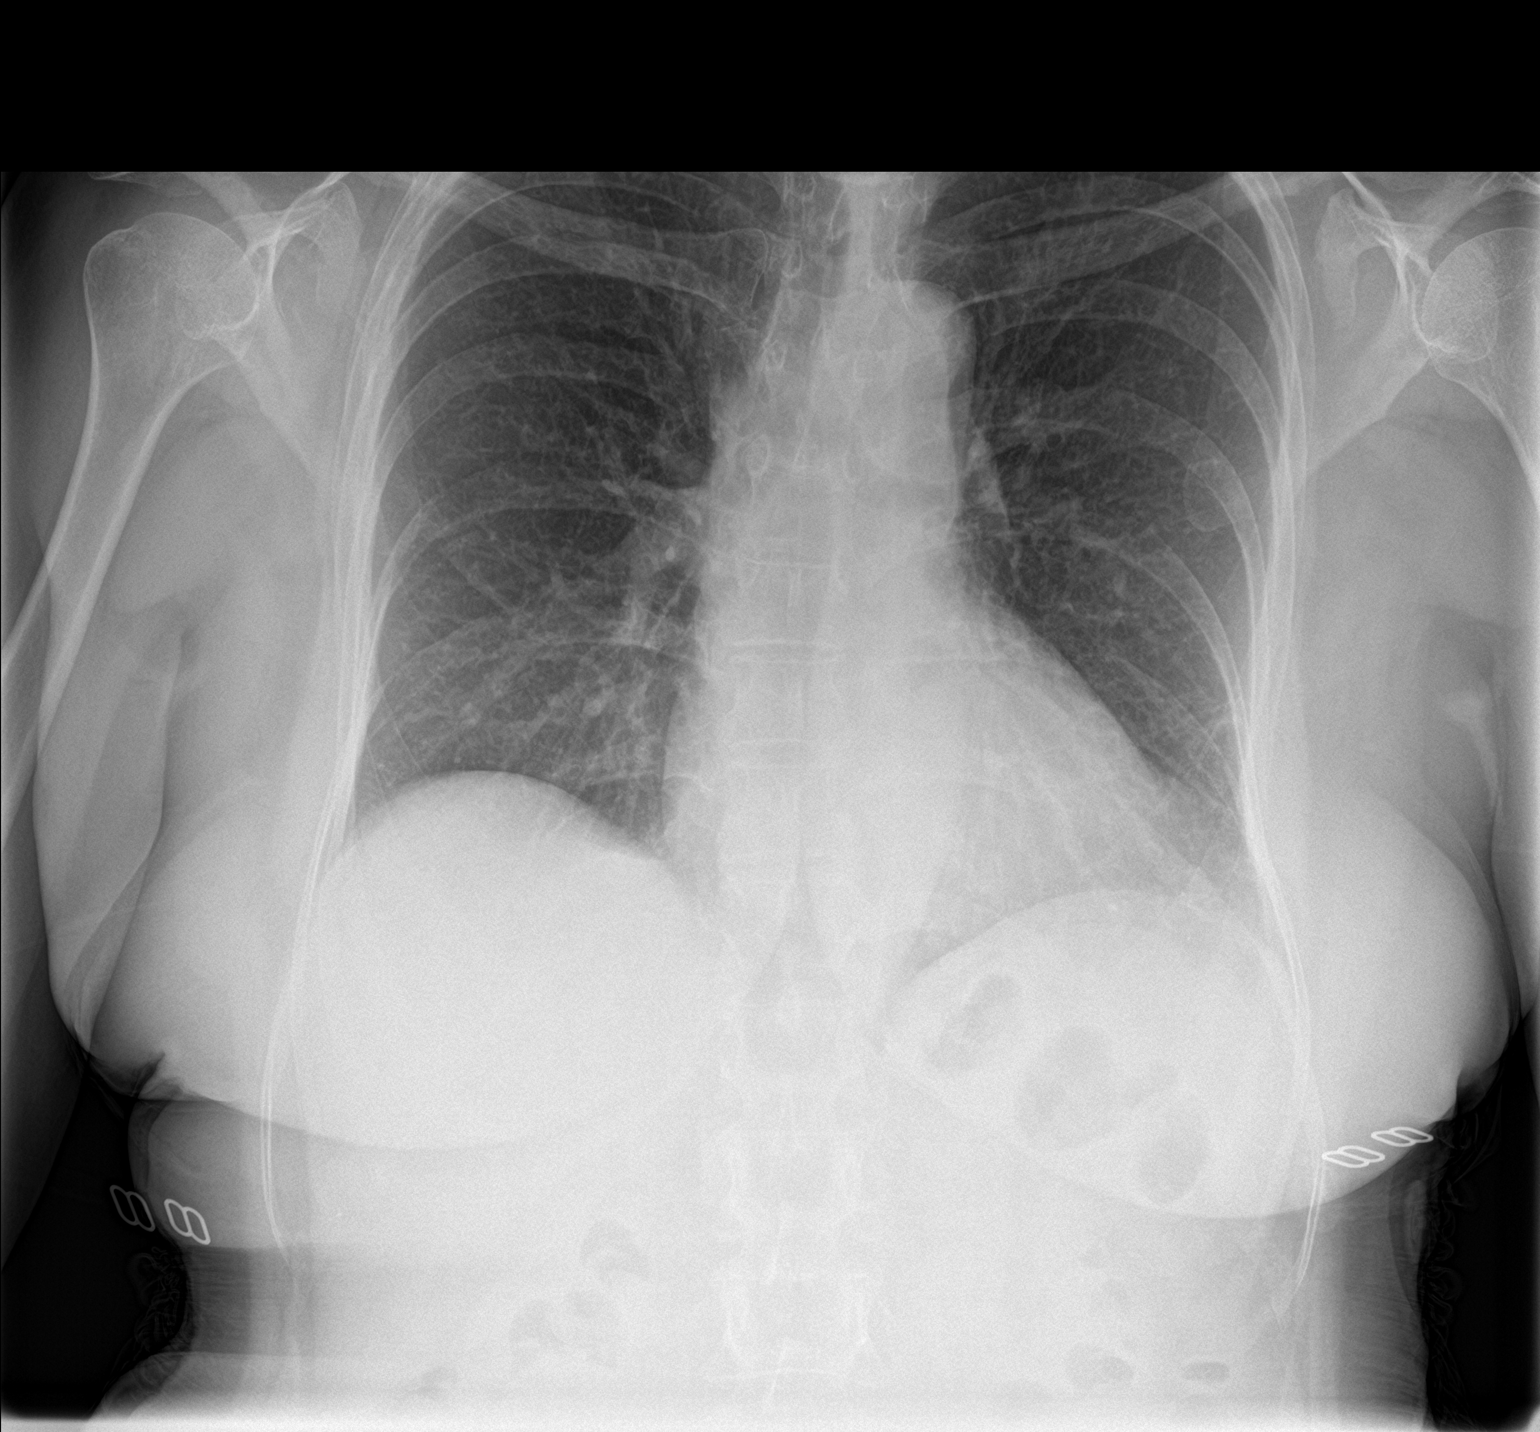

[chest lat]
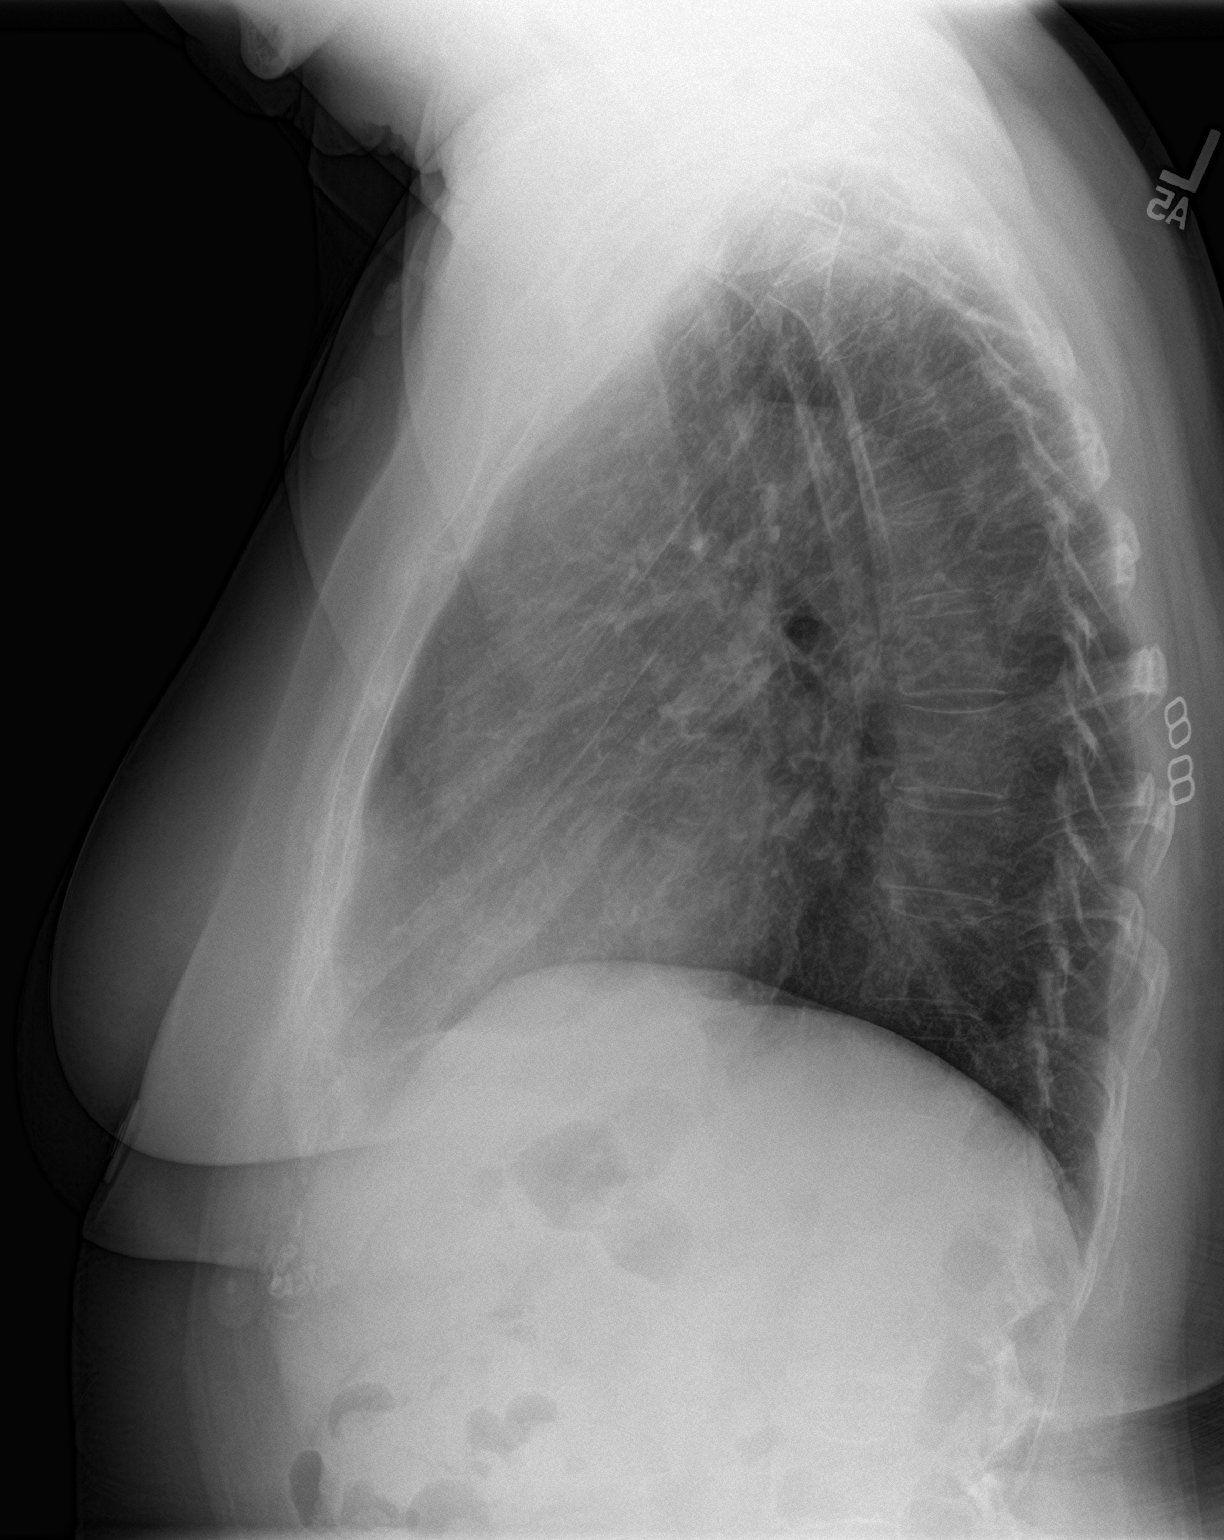

[2 of 2 positions shown; findings below may reference images not displayed]

FINDINGS: Heart size is normal. Mediastinal shadows are normal. The lungs are
clear. No bronchial thickening. No infiltrate, mass, effusion or
collapse. Pulmonary vascularity is normal. No bony abnormality.
IMPRESSION: Normal chest

## 2019-05-04 ENCOUNTER — Ambulatory Visit: Payer: Medicare Other | Admitting: Allergy & Immunology

## 2019-06-27 ENCOUNTER — Encounter: Payer: Self-pay | Admitting: Internal Medicine

## 2019-06-27 ENCOUNTER — Other Ambulatory Visit: Payer: Self-pay | Admitting: Obstetrics & Gynecology

## 2019-06-27 DIAGNOSIS — E2839 Other primary ovarian failure: Secondary | ICD-10-CM

## 2019-06-29 ENCOUNTER — Other Ambulatory Visit: Payer: Self-pay | Admitting: Obstetrics & Gynecology

## 2019-06-29 DIAGNOSIS — Z1231 Encounter for screening mammogram for malignant neoplasm of breast: Secondary | ICD-10-CM

## 2019-07-06 ENCOUNTER — Other Ambulatory Visit: Payer: Self-pay | Admitting: Obstetrics & Gynecology

## 2019-07-06 DIAGNOSIS — R1030 Lower abdominal pain, unspecified: Secondary | ICD-10-CM

## 2019-07-12 ENCOUNTER — Ambulatory Visit
Admission: RE | Admit: 2019-07-12 | Discharge: 2019-07-12 | Disposition: A | Discharge status: Home / Self Care | Payer: Medicare Other | Source: Ambulatory Visit | Attending: Obstetrics & Gynecology | Admitting: Obstetrics & Gynecology

## 2019-07-12 DIAGNOSIS — R1030 Lower abdominal pain, unspecified: Secondary | ICD-10-CM

## 2019-07-12 MED ORDER — IOPAMIDOL (ISOVUE-300) INJECTION 61%
100.0000 mL | Freq: Once | INTRAVENOUS | Status: AC | PRN
  Administered 2019-07-12: 100 mL via INTRAVENOUS

## 2019-07-26 ENCOUNTER — Other Ambulatory Visit: Payer: Self-pay | Admitting: Obstetrics & Gynecology

## 2019-07-26 DIAGNOSIS — N83209 Unspecified ovarian cyst, unspecified side: Secondary | ICD-10-CM

## 2019-08-01 ENCOUNTER — Ambulatory Visit
Admission: RE | Admit: 2019-08-01 | Discharge: 2019-08-01 | Disposition: A | Discharge status: Home / Self Care | Payer: Medicare Other | Source: Ambulatory Visit | Attending: Obstetrics & Gynecology | Admitting: Obstetrics & Gynecology

## 2019-08-01 DIAGNOSIS — N83209 Unspecified ovarian cyst, unspecified side: Secondary | ICD-10-CM

## 2019-09-07 ENCOUNTER — Other Ambulatory Visit: Payer: Self-pay

## 2019-09-07 ENCOUNTER — Ambulatory Visit
Admission: RE | Admit: 2019-09-07 | Discharge: 2019-09-07 | Disposition: A | Discharge status: Home / Self Care | Payer: Medicare Other | Source: Ambulatory Visit | Attending: Obstetrics & Gynecology | Admitting: Obstetrics & Gynecology

## 2019-09-07 DIAGNOSIS — E2839 Other primary ovarian failure: Secondary | ICD-10-CM

## 2019-09-07 DIAGNOSIS — Z1231 Encounter for screening mammogram for malignant neoplasm of breast: Secondary | ICD-10-CM

## 2020-04-22 ENCOUNTER — Other Ambulatory Visit: Payer: Self-pay | Admitting: Obstetrics & Gynecology

## 2020-04-22 DIAGNOSIS — R102 Pelvic and perineal pain: Secondary | ICD-10-CM

## 2020-05-02 ENCOUNTER — Ambulatory Visit
Admission: RE | Admit: 2020-05-02 | Discharge: 2020-05-02 | Disposition: A | Discharge status: Home / Self Care | Payer: Medicare Other | Source: Ambulatory Visit | Attending: Obstetrics & Gynecology | Admitting: Obstetrics & Gynecology

## 2020-05-02 ENCOUNTER — Other Ambulatory Visit: Payer: Self-pay

## 2020-05-02 DIAGNOSIS — R102 Pelvic and perineal pain: Secondary | ICD-10-CM

## 2020-12-16 DIAGNOSIS — E7800 Pure hypercholesterolemia, unspecified: Secondary | ICD-10-CM | POA: Diagnosis not present

## 2020-12-16 DIAGNOSIS — R030 Elevated blood-pressure reading, without diagnosis of hypertension: Secondary | ICD-10-CM | POA: Diagnosis not present

## 2020-12-16 DIAGNOSIS — Z0000 Encounter for general adult medical examination without abnormal findings: Secondary | ICD-10-CM | POA: Diagnosis not present

## 2020-12-16 DIAGNOSIS — R059 Cough, unspecified: Secondary | ICD-10-CM | POA: Diagnosis not present

## 2021-03-21 DIAGNOSIS — R519 Headache, unspecified: Secondary | ICD-10-CM | POA: Diagnosis not present

## 2021-03-21 DIAGNOSIS — Z20822 Contact with and (suspected) exposure to covid-19: Secondary | ICD-10-CM | POA: Diagnosis not present

## 2021-04-09 DIAGNOSIS — L820 Inflamed seborrheic keratosis: Secondary | ICD-10-CM | POA: Diagnosis not present

## 2021-04-09 DIAGNOSIS — L821 Other seborrheic keratosis: Secondary | ICD-10-CM | POA: Diagnosis not present

## 2021-04-09 DIAGNOSIS — L570 Actinic keratosis: Secondary | ICD-10-CM | POA: Diagnosis not present

## 2021-04-09 DIAGNOSIS — D2271 Melanocytic nevi of right lower limb, including hip: Secondary | ICD-10-CM | POA: Diagnosis not present

## 2021-04-09 DIAGNOSIS — D225 Melanocytic nevi of trunk: Secondary | ICD-10-CM | POA: Diagnosis not present

## 2021-04-09 DIAGNOSIS — Z85828 Personal history of other malignant neoplasm of skin: Secondary | ICD-10-CM | POA: Diagnosis not present

## 2021-05-27 DIAGNOSIS — L820 Inflamed seborrheic keratosis: Secondary | ICD-10-CM | POA: Diagnosis not present

## 2021-05-27 DIAGNOSIS — C44722 Squamous cell carcinoma of skin of right lower limb, including hip: Secondary | ICD-10-CM | POA: Diagnosis not present

## 2021-07-22 DIAGNOSIS — Z1231 Encounter for screening mammogram for malignant neoplasm of breast: Secondary | ICD-10-CM | POA: Diagnosis not present

## 2021-07-22 DIAGNOSIS — Z7689 Persons encountering health services in other specified circumstances: Secondary | ICD-10-CM | POA: Diagnosis not present

## 2021-08-08 DIAGNOSIS — T7840XA Allergy, unspecified, initial encounter: Secondary | ICD-10-CM | POA: Diagnosis not present

## 2021-08-08 DIAGNOSIS — Z23 Encounter for immunization: Secondary | ICD-10-CM | POA: Diagnosis not present

## 2021-08-11 DIAGNOSIS — H02843 Edema of right eye, unspecified eyelid: Secondary | ICD-10-CM | POA: Diagnosis not present

## 2021-09-03 ENCOUNTER — Ambulatory Visit (INDEPENDENT_AMBULATORY_CARE_PROVIDER_SITE_OTHER): Payer: Medicare Other

## 2021-09-03 ENCOUNTER — Other Ambulatory Visit: Payer: Self-pay

## 2021-09-03 ENCOUNTER — Ambulatory Visit: Payer: Medicare Other | Admitting: Pulmonary Disease

## 2021-09-03 ENCOUNTER — Encounter: Payer: Self-pay | Admitting: Pulmonary Disease

## 2021-09-03 VITALS — BP 128/84 | HR 93 | Ht 66.5 in | Wt 174.8 lb

## 2021-09-03 DIAGNOSIS — R053 Chronic cough: Secondary | ICD-10-CM | POA: Diagnosis not present

## 2021-09-03 DIAGNOSIS — J683 Other acute and subacute respiratory conditions due to chemicals, gases, fumes and vapors: Secondary | ICD-10-CM | POA: Diagnosis not present

## 2021-09-03 MED ORDER — FLUTICASONE FUROATE-VILANTEROL 200-25 MCG/ACT IN AEPB: 1.0000 | INHALATION_SPRAY | Freq: Every day | RESPIRATORY_TRACT | 6 refills | Status: DC

## 2021-09-03 NOTE — Progress Notes (Signed)
Synopsis: Referred in November 2022 for chronic cough by Lavone Orn, MD  Subjective:   PATIENT ID: Melody Wilkins GENDER: female DOB: 1950/11/08, MRN: [id number]  HPI  Chief Complaint  Patient presents with   Consult    Referred by PCP for chronic cough for the past 3 years. Cough is sometimes productive with thick clear mucus. Does tend to wheeze more at night.    Melody Wilkins is a 70 year old woman, never smoker who is referred to pulmonary clinic for chronic cough.   She reports having cough over the last 3 or more years that is mainly dry with occasional clear sputum production.  She does have intermittent wheezing associated with the cough.  There is no seasonality to the cough.  She denies any seasonal allergies.  She was previously seen at the allergy and asthma clinic where she had spirometry performed with reduced FEV1 and FVC.  She has been trialed on inhalers in the past, she has been on Flovent over the past 1 year with some improvement initially but of recent has not noticed any relief from the inhaler.  She also complains of sharp pains at the bottom of her lungs with sneezing or coughing.  She denies any limitations to her daily activities or shortness of breath with exertion.  The cough will occasionally awaken her from sleep.  She denies sinus congestion or postnasal drainage.  She denies heartburn or reflux symptoms.  She was empirically treated with PPI therapy by her primary care physician for the cough without improvement.  She denies any increased cough with eating or drinking.  She denies any dysphagia.  No pets at home.  Denies any joint aches or skin rashes.  She works as an Futures trader.  She is a never smoker and denies any secondhand smoke exposure.  She has 2 boys and lives with her husband.  No family history of lung conditions or autoimmune conditions.  Past Medical History:  Diagnosis Date   DJD (degenerative joint disease)    RIGHT ANKLE    Elevated blood pressure reading      Family History  Problem Relation Age of Onset   Ovarian cancer Mother 1   Hypertension Father 72   Congestive Heart Failure Father    Kidney failure Father    Liver disease Father      Social History   Socioeconomic History   Marital status: Married    Spouse name: Not on file   Number of children: 2   Years of education: Not on file   Highest education level: Not on file  Occupational History   Occupation: RETIRED  Tobacco Use   Smoking status: Never   Smokeless tobacco: Never  Vaping Use   Vaping Use: Never used  Substance and Sexual Activity   Alcohol use: Yes    Alcohol/week: 1.0 standard drink    Types: 1 Glasses of wine per week   Drug use: No   Sexual activity: Not on file  Other Topics Concern   Not on file  Social History Narrative   Not on file   Social Determinants of Health   Financial Resource Strain: Not on file  Food Insecurity: Not on file  Transportation Needs: Not on file  Physical Activity: Not on file  Stress: Not on file  Social Connections: Not on file  Intimate Partner Violence: Not on file     Allergies  Allergen Reactions   Penicillins Hives and Rash  Has patient had a PCN reaction causing immediate rash, facial/tongue/throat swelling, SOB or lightheadedness with hypotension: Yes Has patient had a PCN reaction causing severe rash involving mucus membranes or skin necrosis: Yes Has patient had a PCN reaction that required hospitalization: No Has patient had a PCN reaction occurring within the last 10 years: No If all of the above answers are "NO", then may proceed with Cephalosporin use.    Omeprazole Diarrhea   Amoxicillin Hives and Rash     Outpatient Medications Prior to Visit  Medication Sig Dispense Refill   Multiple Vitamin (MULTIVITAMIN) tablet Take 1 tablet by mouth daily.     Fluticasone Propionate, Inhal, 100 MCG/ACT AEPB Inhale into the lungs.     budesonide-formoterol  (SYMBICORT) 160-4.5 MCG/ACT inhaler Inhale 2 puffs into the lungs 2 (two) times daily for 30 days. 1 Inhaler 5   carvedilol (COREG) 3.125 MG tablet TAKE 1 TABLET(3.125 MG) BY MOUTH TWICE DAILY 180 tablet 2   famotidine (PEPCID) 40 MG tablet Take 1 tablet (40 mg total) by mouth at bedtime. 30 tablet 5   omeprazole (PRILOSEC) 40 MG capsule Take 1 capsule (40 mg total) by mouth every morning. 30 capsule 5   No facility-administered medications prior to visit.    Review of Systems  Constitutional:  Negative for chills, fever, malaise/fatigue and weight loss.  HENT:  Negative for congestion, sinus pain and sore throat.   Eyes: Negative.   Respiratory:  Positive for cough, sputum production and wheezing. Negative for hemoptysis and shortness of breath.   Cardiovascular:  Negative for chest pain, palpitations, orthopnea, claudication and leg swelling.  Gastrointestinal:  Positive for abdominal pain. Negative for heartburn, nausea and vomiting.  Genitourinary: Negative.   Musculoskeletal:  Negative for joint pain and myalgias.  Skin:  Negative for rash.  Neurological:  Negative for weakness.  Endo/Heme/Allergies: Negative.   Psychiatric/Behavioral: Negative.       Objective:   Vitals:   09/03/21 1525  BP: 128/84  Pulse: 93  SpO2: 97%  Weight: 174 lb 12.8 oz (79.3 kg)  Height: 5' 6.5" (1.689 m)     Physical Exam Constitutional:      General: She is not in acute distress.    Appearance: She is not ill-appearing.  HENT:     Head: Normocephalic and atraumatic.  Eyes:     General: No scleral icterus.    Conjunctiva/sclera: Conjunctivae normal.     Pupils: Pupils are equal, round, and reactive to light.  Cardiovascular:     Rate and Rhythm: Normal rate and regular rhythm.     Pulses: Normal pulses.     Heart sounds: Normal heart sounds. No murmur heard. Pulmonary:     Effort: Pulmonary effort is normal.     Breath sounds: Normal breath sounds. No wheezing, rhonchi or rales.   Musculoskeletal:     Right lower leg: No edema.     Left lower leg: No edema.  Skin:    General: Skin is warm and dry.  Neurological:     General: No focal deficit present.     Mental Status: She is alert.  Psychiatric:        Mood and Affect: Mood normal.        Behavior: Behavior normal.        Thought Content: Thought content normal.        Judgment: Judgment normal.   CBC    Component Value Date/Time   WBC 8.0 11/11/2017 0800   RBC 4.43 11/11/2017  0800   HGB 13.0 11/11/2017 0800   HCT 39.0 11/11/2017 0800   PLT 293 11/11/2017 0800   MCV 88.0 11/11/2017 0800   MCH 29.3 11/11/2017 0800   MCHC 33.3 11/11/2017 0800   RDW 12.8 11/11/2017 0800   BMP Latest Ref Rng & Units 11/11/2017  Glucose 65 - 99 mg/dL 125(H)  BUN 6 - 20 mg/dL 12  Creatinine 0.44 - 1.00 mg/dL 0.66  Sodium 135 - 145 mmol/L 141  Potassium 3.5 - 5.1 mmol/L 4.0  Chloride 101 - 111 mmol/L 108  CO2 22 - 32 mmol/L 21(L)  Calcium 8.9 - 10.3 mg/dL 9.1   Chest imaging: CT Abdomen/Pelvis 07/12/19 Lower lung fields unremarkable.   PFT: No flowsheet data found.  Labs:  Path:  Echo 11/19/2017: LV EF 60-65%. RV size and function normal. 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   Her chronic cough may be related to reactive airways disease as she has had some improvement with ICS inhaler therapy in the past.  There does not appear to be a component of sinus disease or reflux disease contributing to her chronic cough.  We will treat her with Breo Ellipta 200-25 MCG 1 puff daily and monitor for  symptom improvement.  We will obtain pulmonary function tests at follow-up in 1 month and we will check a chest radiograph today.  We will obtain CBC with differential and IgE level.  If there is abnormality of her chest radiograph or pulmonary function testing we will consider further evaluation with high-resolution CT chest scan.  Follow up 4 weeks.  Freda Jackson, MD [facility] Office: [phone]   Current Outpatient Medications:    fluticasone furoate-vilanterol (BREO ELLIPTA) 200-25 MCG/ACT AEPB, Inhale 1 puff into the lungs daily., Disp: 28 each, Rfl: 6   Multiple Vitamin (MULTIVITAMIN) tablet, Take 1 tablet by mouth daily., Disp: , Rfl:

## 2021-09-03 NOTE — Patient Instructions (Signed)
We will check lab work today  We will check a chest radiograph today  Start you on Breo Ellipta inhaler 1 puff daily - rinse mouth out after each use  Follow up in 1 month with pulmonary function tests

## 2021-09-04 LAB — CBC WITH DIFFERENTIAL/PLATELET
Basophils Absolute: 0.1 10*3/uL (ref 0.0–0.1)
Basophils Relative: 1.2 % (ref 0.0–3.0)
Eosinophils Absolute: 0.4 10*3/uL (ref 0.0–0.7)
Eosinophils Relative: 4.7 % (ref 0.0–5.0)
HCT: 39.4 % (ref 36.0–46.0)
Hemoglobin: 13 g/dL (ref 12.0–15.0)
Lymphocytes Relative: 26.8 % (ref 12.0–46.0)
Lymphs Abs: 2 10*3/uL (ref 0.7–4.0)
MCHC: 33 g/dL (ref 30.0–36.0)
MCV: 88.9 fl (ref 78.0–100.0)
Monocytes Absolute: 0.7 10*3/uL (ref 0.1–1.0)
Monocytes Relative: 9.1 % (ref 3.0–12.0)
Neutro Abs: 4.5 10*3/uL (ref 1.4–7.7)
Neutrophils Relative %: 58.2 % (ref 43.0–77.0)
Platelets: 278 10*3/uL (ref 150.0–400.0)
RBC: 4.43 Mil/uL (ref 3.87–5.11)
RDW: 13.6 % (ref 11.5–15.5)
WBC: 7.6 10*3/uL (ref 4.0–10.5)

## 2021-09-04 LAB — IGE: IgE (Immunoglobulin E), Serum: 29 kU/L (ref <=114)

## 2021-10-14 ENCOUNTER — Encounter: Payer: Self-pay | Admitting: Pulmonary Disease

## 2021-10-14 ENCOUNTER — Other Ambulatory Visit: Payer: Self-pay

## 2021-10-14 ENCOUNTER — Ambulatory Visit (INDEPENDENT_AMBULATORY_CARE_PROVIDER_SITE_OTHER): Payer: Medicare Other | Admitting: Pulmonary Disease

## 2021-10-14 ENCOUNTER — Ambulatory Visit: Payer: Medicare Other | Admitting: Pulmonary Disease

## 2021-10-14 VITALS — BP 126/82 | HR 88 | Ht 67.0 in | Wt 178.2 lb

## 2021-10-14 DIAGNOSIS — J4540 Moderate persistent asthma, uncomplicated: Secondary | ICD-10-CM | POA: Diagnosis not present

## 2021-10-14 DIAGNOSIS — R053 Chronic cough: Secondary | ICD-10-CM | POA: Diagnosis not present

## 2021-10-14 LAB — PULMONARY FUNCTION TEST
DL/VA % pred: 124 %
DL/VA: 5.04 ml/min/mmHg/L
DLCO cor % pred: 84 %
DLCO cor: 18.3 ml/min/mmHg
DLCO unc % pred: 83 %
DLCO unc: 18.07 ml/min/mmHg
FEF 25-75 Post: 1.9 L/sec
FEF 25-75 Pre: 1.44 L/sec
FEF2575-%Change-Post: 31 %
FEF2575-%Pred-Post: 87 %
FEF2575-%Pred-Pre: 66 %
FEV1-%Change-Post: 3 %
FEV1-%Pred-Post: 67 %
FEV1-%Pred-Pre: 65 %
FEV1-Post: 1.78 L
FEV1-Pre: 1.72 L
FEV1FVC-%Change-Post: 0 %
FEV1FVC-%Pred-Pre: 103 %
FEV6-%Change-Post: 2 %
FEV6-%Pred-Post: 67 %
FEV6-%Pred-Pre: 66 %
FEV6-Post: 2.24 L
FEV6-Pre: 2.18 L
FEV6FVC-%Pred-Post: 104 %
FEV6FVC-%Pred-Pre: 104 %
FVC-%Change-Post: 2 %
FVC-%Pred-Post: 65 %
FVC-%Pred-Pre: 63 %
FVC-Post: 2.24 L
FVC-Pre: 2.18 L
Post FEV1/FVC ratio: 80 %
Post FEV6/FVC ratio: 100 %
Pre FEV1/FVC ratio: 79 %
Pre FEV6/FVC Ratio: 100 %
RV % pred: 86 %
RV: 1.99 L
TLC % pred: 76 %
TLC: 4.23 L

## 2021-10-14 MED ORDER — ALBUTEROL SULFATE HFA 108 (90 BASE) MCG/ACT IN AERS: 2.0000 | INHALATION_SPRAY | Freq: Four times a day (QID) | RESPIRATORY_TRACT | 6 refills | Status: AC | PRN

## 2021-10-14 MED ORDER — TRELEGY ELLIPTA 100-62.5-25 MCG/ACT IN AEPB: 1.0000 | INHALATION_SPRAY | Freq: Every day | RESPIRATORY_TRACT | 6 refills | Status: AC

## 2021-10-14 NOTE — Progress Notes (Signed)
Synopsis: Referred in November 2022 for chronic cough by Lavone Orn, MD  Subjective:   PATIENT ID: Melody Wilkins GENDER: female DOB: [birth year], MRN: [id number]  HPI  Chief Complaint  Patient presents with   Follow-up    1 mo f/u after PFT. States her cough has decreased since last visit.    Melody Wilkins is a 70 year old woman, never smoker who returns to pulmonary clinic for chronic cough.   She was started on breo ellipta 200-25 MCG 1 puff daily for concern of reactive airways disease with about 50% improvement in her cough symptoms.  PFTs today show mild restrictive defect.  Chest radiograph from last visit was unremarkable.  CBC with differential showed absolute eosinophil count of 400.   OV 09/03/21 She reports having cough over the last 3 or more years that is mainly dry with occasional clear sputum production.  She does have intermittent wheezing associated with the cough.  There is no seasonality to the cough.  She denies any seasonal allergies.  She was previously seen at the allergy and asthma clinic where she had spirometry performed with reduced FEV1 and FVC.  She has been trialed on inhalers in the past, she has been on Flovent over the past 1 year with some improvement initially but of recent has not noticed any relief from the inhaler.  She also complains of sharp pains at the bottom of her lungs with sneezing or coughing.  She denies any limitations to her daily activities or shortness of breath with exertion.  The cough will occasionally awaken her from sleep.  She denies sinus congestion or postnasal drainage.  She denies heartburn or reflux symptoms.  She was empirically treated with PPI therapy by her primary care physician for the cough without improvement.  She denies any increased cough with eating or drinking.  She denies any dysphagia.  No pets at home.  Denies any joint aches or skin rashes.  She works as an Futures trader.  She is a never smoker  and denies any secondhand smoke exposure.  She has 2 boys and lives with her husband.  No family history of lung conditions or autoimmune conditions.  Past Medical History:  Diagnosis Date   DJD (degenerative joint disease)    RIGHT ANKLE   Elevated blood pressure reading      Family History  Problem Relation Age of Onset   Ovarian cancer Mother 40   Hypertension Father 82   Congestive Heart Failure Father    Kidney failure Father    Liver disease Father      Social History   Socioeconomic History   Marital status: Married    Spouse name: Not on file   Number of children: 2   Years of education: Not on file   Highest education level: Not on file  Occupational History   Occupation: RETIRED  Tobacco Use   Smoking status: Never   Smokeless tobacco: Never  Vaping Use   Vaping Use: Never used  Substance and Sexual Activity   Alcohol use: Yes    Alcohol/week: 1.0 standard drink    Types: 1 Glasses of wine per week   Drug use: No   Sexual activity: Not on file  Other Topics Concern   Not on file  Social History Narrative   Not on file   Social Determinants of Health   Financial Resource Strain: Not on file  Food Insecurity: Not on file  Transportation Needs: Not on file  Physical Activity: Not on file  Stress: Not on file  Social Connections: Not on file  Intimate Partner Violence: Not on file     Allergies  Allergen Reactions   Penicillins Hives and Rash    Has patient had a PCN reaction causing immediate rash, facial/tongue/throat swelling, SOB or lightheadedness with hypotension: Yes Has patient had a PCN reaction causing severe rash involving mucus membranes or skin necrosis: Yes Has patient had a PCN reaction that required hospitalization: No Has patient had a PCN reaction occurring within the last 10 years: No If all of the above answers are "NO", then may proceed with Cephalosporin use.    Omeprazole Diarrhea   Amoxicillin Hives and Rash      Outpatient Medications Prior to Visit  Medication Sig Dispense Refill   Multiple Vitamin (MULTIVITAMIN) tablet Take 1 tablet by mouth daily.     fluticasone furoate-vilanterol (BREO ELLIPTA) 200-25 MCG/ACT AEPB Inhale 1 puff into the lungs daily. 28 each 6   No facility-administered medications prior to visit.    Review of Systems  Constitutional:  Negative for chills, fever, malaise/fatigue and weight loss.  HENT:  Negative for congestion, sinus pain and sore throat.   Eyes: Negative.   Respiratory:  Positive for cough and wheezing. Negative for hemoptysis, sputum production and shortness of breath.   Cardiovascular:  Negative for chest pain, palpitations, orthopnea, claudication and leg swelling.  Gastrointestinal:  Negative for abdominal pain, heartburn, nausea and vomiting.  Genitourinary: Negative.   Musculoskeletal:  Negative for joint pain and myalgias.  Skin:  Negative for rash.  Neurological:  Negative for weakness.  Endo/Heme/Allergies: Negative.   Psychiatric/Behavioral: Negative.       Objective:   Vitals:   10/14/21 1354  BP: 126/82  Pulse: 88  SpO2: 97%  Weight: 178 lb 3.2 oz (80.8 kg)  Height: 5\' 7"  (1.702 m)   Physical Exam Constitutional:      General: She is not in acute distress.    Appearance: She is not ill-appearing.  HENT:     Head: Normocephalic and atraumatic.  Eyes:     General: No scleral icterus.    Conjunctiva/sclera: Conjunctivae normal.  Cardiovascular:     Rate and Rhythm: Normal rate and regular rhythm.     Pulses: Normal pulses.     Heart sounds: Normal heart sounds. No murmur heard. Pulmonary:     Effort: Pulmonary effort is normal.     Breath sounds: Normal breath sounds. No wheezing, rhonchi or rales.  Musculoskeletal:     Right lower leg: No edema.     Left lower leg: No edema.  Skin:    General: Skin is warm and dry.  Neurological:     General: No focal deficit present.     Mental Status: She is alert.  Psychiatric:         Mood and Affect: Mood normal.        Behavior: Behavior normal.        Thought Content: Thought content normal.        Judgment: Judgment normal.   CBC    Component Value Date/Time   WBC 7.6 09/03/2021 1556   RBC 4.43 09/03/2021 1556   HGB 13.0 09/03/2021 1556   HCT 39.4 09/03/2021 1556   PLT 278.0 09/03/2021 1556   MCV 88.9 09/03/2021 1556   MCH 29.3 11/11/2017 0800   MCHC 33.0 09/03/2021 1556   RDW 13.6 09/03/2021 1556   LYMPHSABS 2.0 09/03/2021 1556   MONOABS 0.7  09/03/2021 1556   EOSABS 0.4 09/03/2021 1556   BASOSABS 0.1 09/03/2021 1556   BMP Latest Ref Rng & Units 11/11/2017  Glucose 65 - 99 mg/dL 125(H)  BUN 6 - 20 mg/dL 12  Creatinine 0.44 - 1.00 mg/dL 0.66  Sodium 135 - 145 mmol/L 141  Potassium 3.5 - 5.1 mmol/L 4.0  Chloride 101 - 111 mmol/L 108  CO2 22 - 32 mmol/L 21(L)  Calcium 8.9 - 10.3 mg/dL 9.1   Chest imaging: CXR 09/03/21 The heart size and mediastinal contours are within normal limits. No focal airspace consolidation, pleural effusion, or pneumothorax. The visualized skeletal structures are unremarkable.  CT Abdomen/Pelvis 07/12/19 Lower lung fields unremarkable.   PFT: PFT Results Latest Ref Rng & Units 10/14/2021  FVC-Pre L 2.18  FVC-Predicted Pre % 63  FVC-Post L 2.24  FVC-Predicted Post % 65  Pre FEV1/FVC % % 79  Post FEV1/FCV % % 80  FEV1-Pre L 1.72  FEV1-Predicted Pre % 65  FEV1-Post L 1.78  DLCO uncorrected ml/min/mmHg 18.07  DLCO UNC% % 83  DLCO corrected ml/min/mmHg 18.30  DLCO COR %Predicted % 84  DLVA Predicted % 124  TLC L 4.23  TLC % Predicted % 76  RV % Predicted % 86    Labs:  Path:  Echo 11/19/2017: LV EF 60-65%. RV size and function normal. No valvular issues Heart Catheterization:  Assessment & Plan:   Moderate persistent asthma without complication - Plan: Fluticasone-Umeclidin-Vilant (TRELEGY ELLIPTA) 100-62.5-25 MCG/ACT AEPB, albuterol (VENTOLIN HFA) 108 (90 Base) MCG/ACT inhaler  Discussion: Melody Wilkins is a 70 year old woman, never smoker who returns to pulmonary clinic for chronic cough.   Her chronic cough appears related to reactive airways disease/asthma as she experienced improvement with Breo Ellipta 200-25 MCG 1 puff daily.  She continues to have ongoing symptoms of cough and intermittent wheezing.  We will transition her to Trelegy Ellipta 1 puff daily and monitor for any improvement in her symptoms. There is mild restrictive defect on pulmonary function tests today. We can consider HRCT chest in future for further evaluation.  She has an absolute eosinophil count of 400 and we can consider biologic therapy in the future if she remains symptomatic despite ICS/LABA/LAMA inhaler therapy.  We can also consider trial of montelukast in the future.  Follow up 4-6 weeks via video visit.  Freda Jackson, MD [facility] Office: [phone]   Current Outpatient Medications:    albuterol (VENTOLIN HFA) 108 (90 Base) MCG/ACT inhaler, Inhale 2 puffs into the lungs every 6 (six) hours as needed for wheezing or shortness of breath., Disp: 8 g, Rfl: 6   Fluticasone-Umeclidin-Vilant (TRELEGY ELLIPTA) 100-62.5-25 MCG/ACT AEPB, Inhale 1 puff into the lungs daily., Disp: 28 each, Rfl: 6   Multiple Vitamin (MULTIVITAMIN) tablet, Take 1 tablet by mouth daily., Disp: , Rfl:

## 2021-10-14 NOTE — Progress Notes (Signed)
PFT done today. 

## 2021-10-14 NOTE — Patient Instructions (Signed)
Stop breo ellipta inhaler  Start Trelegy Ellipta inhaler 1 puff daily - rinse mouth out after each use  Use albuterol inhaler 1-2 puffs every 4-6 hours as needed for cough, wheezing or shortness of breath.  Follow up in 4-6 weeks via video visit to see how new inhaler is working

## 2021-11-05 DIAGNOSIS — B079 Viral wart, unspecified: Secondary | ICD-10-CM | POA: Diagnosis not present

## 2021-11-05 DIAGNOSIS — L814 Other melanin hyperpigmentation: Secondary | ICD-10-CM | POA: Diagnosis not present

## 2021-11-05 DIAGNOSIS — L821 Other seborrheic keratosis: Secondary | ICD-10-CM | POA: Diagnosis not present

## 2021-11-05 DIAGNOSIS — C44729 Squamous cell carcinoma of skin of left lower limb, including hip: Secondary | ICD-10-CM | POA: Diagnosis not present

## 2021-11-05 DIAGNOSIS — D485 Neoplasm of uncertain behavior of skin: Secondary | ICD-10-CM | POA: Diagnosis not present

## 2021-11-05 DIAGNOSIS — L570 Actinic keratosis: Secondary | ICD-10-CM | POA: Diagnosis not present

## 2021-11-05 DIAGNOSIS — D225 Melanocytic nevi of trunk: Secondary | ICD-10-CM | POA: Diagnosis not present

## 2021-11-05 DIAGNOSIS — Z85828 Personal history of other malignant neoplasm of skin: Secondary | ICD-10-CM | POA: Diagnosis not present

## 2021-11-21 ENCOUNTER — Ambulatory Visit: Payer: Medicare Other | Admitting: Pulmonary Disease

## 2021-11-25 ENCOUNTER — Telehealth: Payer: Medicare Other | Admitting: Pulmonary Disease

## 2021-11-28 ENCOUNTER — Telehealth (INDEPENDENT_AMBULATORY_CARE_PROVIDER_SITE_OTHER): Payer: Medicare Other | Admitting: Pulmonary Disease

## 2021-11-28 ENCOUNTER — Other Ambulatory Visit: Payer: Self-pay

## 2021-11-28 ENCOUNTER — Encounter: Payer: Self-pay | Admitting: Pulmonary Disease

## 2021-11-28 DIAGNOSIS — J4540 Moderate persistent asthma, uncomplicated: Secondary | ICD-10-CM | POA: Diagnosis not present

## 2021-11-28 NOTE — Progress Notes (Signed)
Virtual Visit via Video Note  I connected with Melody Wilkins on 11/28/21 at 10:45 AM EST by a video enabled telemedicine application and verified that I am speaking with the correct person using two identifiers.  Location: Patient: home Provider: office   I discussed the limitations of evaluation and management by telemedicine and the availability of in person appointments. The patient expressed understanding and agreed to proceed.  History of Present Illness: Melody Wilkins is a 71 year old woman, never smoker who returns to pulmonary clinic for asthma.   She was started on trelegy ellipta 1 puff daily at last visit from [place] [facility]. She is feeling significantly better and denies shortness of breath or wheezing. She has occasional cough. She denies sinus congestion, post-nasal drainage or reflux issues.   Observations/Objective: No acute distress Well appearing woman No coughing during interview, no clearing of throat Dost not sound nasally congested Talking in full sentences Sclera anicteric  Assessment and Plan: - She has moderate persistent asthma that is well controlled on trelegy ellipta 1 puff daily at this time.  - We will consider adding montelukast on in the future if needed   Follow Up Instructions: Follow up in 6 months   I discussed the assessment and treatment plan with the patient. The patient was provided an opportunity to ask questions and all were answered. The patient agreed with the plan and demonstrated an understanding of the instructions.   The patient was advised to call back or seek an in-person evaluation if the symptoms worsen or if the condition fails to improve as anticipated.  I provided 20 minutes of non-face-to-face time during this encounter.   Freddi Starr, MD

## 2021-11-28 NOTE — Patient Instructions (Signed)
Continue trelegy ellipta 1 puff daily - rinse mouth after each use  Please call us if your symptoms worsen. We will consider adding montelukast in the future if needed.   Follow up in 6 months.

## 2022-04-27 ENCOUNTER — Ambulatory Visit
Admission: RE | Admit: 2022-04-27 | Discharge: 2022-04-27 | Disposition: A | Discharge status: Home / Self Care | Payer: Medicare Other | Source: Ambulatory Visit | Attending: Physician Assistant | Admitting: Physician Assistant

## 2022-04-27 ENCOUNTER — Other Ambulatory Visit: Payer: Self-pay | Admitting: Physician Assistant

## 2022-04-27 DIAGNOSIS — R030 Elevated blood-pressure reading, without diagnosis of hypertension: Secondary | ICD-10-CM | POA: Diagnosis not present

## 2022-04-27 DIAGNOSIS — R059 Cough, unspecified: Secondary | ICD-10-CM | POA: Diagnosis not present

## 2022-04-27 DIAGNOSIS — R0989 Other specified symptoms and signs involving the circulatory and respiratory systems: Secondary | ICD-10-CM

## 2022-04-27 DIAGNOSIS — R051 Acute cough: Secondary | ICD-10-CM | POA: Diagnosis not present

## 2022-04-27 DIAGNOSIS — H66001 Acute suppurative otitis media without spontaneous rupture of ear drum, right ear: Secondary | ICD-10-CM | POA: Diagnosis not present

## 2022-05-22 DIAGNOSIS — I10 Essential (primary) hypertension: Secondary | ICD-10-CM | POA: Diagnosis not present

## 2022-05-22 DIAGNOSIS — M25572 Pain in left ankle and joints of left foot: Secondary | ICD-10-CM | POA: Diagnosis not present

## 2022-06-12 DIAGNOSIS — G8929 Other chronic pain: Secondary | ICD-10-CM | POA: Diagnosis not present

## 2022-06-12 DIAGNOSIS — R519 Headache, unspecified: Secondary | ICD-10-CM | POA: Diagnosis not present

## 2022-06-12 DIAGNOSIS — I10 Essential (primary) hypertension: Secondary | ICD-10-CM | POA: Diagnosis not present

## 2022-06-16 ENCOUNTER — Other Ambulatory Visit: Payer: Self-pay | Admitting: Physician Assistant

## 2022-06-16 DIAGNOSIS — R519 Headache, unspecified: Secondary | ICD-10-CM

## 2022-06-17 ENCOUNTER — Ambulatory Visit
Admission: RE | Admit: 2022-06-17 | Discharge: 2022-06-17 | Disposition: A | Discharge status: Home / Self Care | Payer: Medicare Other | Source: Ambulatory Visit | Attending: Physician Assistant | Admitting: Physician Assistant

## 2022-06-17 DIAGNOSIS — R519 Headache, unspecified: Secondary | ICD-10-CM

## 2022-07-24 DIAGNOSIS — E7800 Pure hypercholesterolemia, unspecified: Secondary | ICD-10-CM | POA: Diagnosis not present

## 2022-07-24 DIAGNOSIS — Z0000 Encounter for general adult medical examination without abnormal findings: Secondary | ICD-10-CM | POA: Diagnosis not present

## 2022-07-24 DIAGNOSIS — I10 Essential (primary) hypertension: Secondary | ICD-10-CM | POA: Diagnosis not present

## 2022-07-24 DIAGNOSIS — J4540 Moderate persistent asthma, uncomplicated: Secondary | ICD-10-CM | POA: Diagnosis not present

## 2022-07-24 DIAGNOSIS — G44219 Episodic tension-type headache, not intractable: Secondary | ICD-10-CM | POA: Diagnosis not present

## 2022-07-24 DIAGNOSIS — Z23 Encounter for immunization: Secondary | ICD-10-CM | POA: Diagnosis not present

## 2022-08-12 DIAGNOSIS — R0989 Other specified symptoms and signs involving the circulatory and respiratory systems: Secondary | ICD-10-CM | POA: Diagnosis not present

## 2022-08-25 DIAGNOSIS — Z1211 Encounter for screening for malignant neoplasm of colon: Secondary | ICD-10-CM | POA: Diagnosis not present

## 2022-08-26 DIAGNOSIS — M542 Cervicalgia: Secondary | ICD-10-CM | POA: Diagnosis not present

## 2022-08-26 DIAGNOSIS — G44219 Episodic tension-type headache, not intractable: Secondary | ICD-10-CM | POA: Diagnosis not present

## 2022-08-26 DIAGNOSIS — R519 Headache, unspecified: Secondary | ICD-10-CM | POA: Diagnosis not present

## 2022-09-01 DIAGNOSIS — M542 Cervicalgia: Secondary | ICD-10-CM | POA: Diagnosis not present

## 2022-09-01 DIAGNOSIS — R519 Headache, unspecified: Secondary | ICD-10-CM | POA: Diagnosis not present

## 2022-09-01 DIAGNOSIS — G44219 Episodic tension-type headache, not intractable: Secondary | ICD-10-CM | POA: Diagnosis not present

## 2022-09-02 LAB — COLOGUARD: COLOGUARD: NEGATIVE

## 2022-09-02 LAB — EXTERNAL GENERIC LAB PROCEDURE: COLOGUARD: NEGATIVE

## 2022-09-04 DIAGNOSIS — G44219 Episodic tension-type headache, not intractable: Secondary | ICD-10-CM | POA: Diagnosis not present

## 2022-09-04 DIAGNOSIS — R519 Headache, unspecified: Secondary | ICD-10-CM | POA: Diagnosis not present

## 2022-09-04 DIAGNOSIS — M542 Cervicalgia: Secondary | ICD-10-CM | POA: Diagnosis not present

## 2022-09-08 DIAGNOSIS — G44219 Episodic tension-type headache, not intractable: Secondary | ICD-10-CM | POA: Diagnosis not present

## 2022-09-08 DIAGNOSIS — R519 Headache, unspecified: Secondary | ICD-10-CM | POA: Diagnosis not present

## 2022-09-08 DIAGNOSIS — M542 Cervicalgia: Secondary | ICD-10-CM | POA: Diagnosis not present

## 2022-09-11 DIAGNOSIS — M542 Cervicalgia: Secondary | ICD-10-CM | POA: Diagnosis not present

## 2022-09-11 DIAGNOSIS — G44219 Episodic tension-type headache, not intractable: Secondary | ICD-10-CM | POA: Diagnosis not present

## 2022-09-11 DIAGNOSIS — R519 Headache, unspecified: Secondary | ICD-10-CM | POA: Diagnosis not present

## 2022-09-25 DIAGNOSIS — R519 Headache, unspecified: Secondary | ICD-10-CM | POA: Diagnosis not present

## 2022-09-25 DIAGNOSIS — M542 Cervicalgia: Secondary | ICD-10-CM | POA: Diagnosis not present

## 2022-09-25 DIAGNOSIS — G44219 Episodic tension-type headache, not intractable: Secondary | ICD-10-CM | POA: Diagnosis not present

## 2022-09-28 DIAGNOSIS — Z1231 Encounter for screening mammogram for malignant neoplasm of breast: Secondary | ICD-10-CM | POA: Diagnosis not present

## 2022-10-01 DIAGNOSIS — H04123 Dry eye syndrome of bilateral lacrimal glands: Secondary | ICD-10-CM | POA: Diagnosis not present

## 2022-11-01 DIAGNOSIS — I10 Essential (primary) hypertension: Secondary | ICD-10-CM | POA: Diagnosis not present

## 2022-11-01 DIAGNOSIS — E7800 Pure hypercholesterolemia, unspecified: Secondary | ICD-10-CM | POA: Diagnosis not present

## 2022-12-23 DIAGNOSIS — L853 Xerosis cutis: Secondary | ICD-10-CM | POA: Diagnosis not present

## 2022-12-23 DIAGNOSIS — Z85828 Personal history of other malignant neoplasm of skin: Secondary | ICD-10-CM | POA: Diagnosis not present

## 2022-12-23 DIAGNOSIS — D485 Neoplasm of uncertain behavior of skin: Secondary | ICD-10-CM | POA: Diagnosis not present

## 2022-12-23 DIAGNOSIS — D1801 Hemangioma of skin and subcutaneous tissue: Secondary | ICD-10-CM | POA: Diagnosis not present

## 2022-12-23 DIAGNOSIS — D0472 Carcinoma in situ of skin of left lower limb, including hip: Secondary | ICD-10-CM | POA: Diagnosis not present

## 2022-12-23 DIAGNOSIS — L738 Other specified follicular disorders: Secondary | ICD-10-CM | POA: Diagnosis not present

## 2022-12-23 DIAGNOSIS — L821 Other seborrheic keratosis: Secondary | ICD-10-CM | POA: Diagnosis not present

## 2022-12-23 DIAGNOSIS — L814 Other melanin hyperpigmentation: Secondary | ICD-10-CM | POA: Diagnosis not present

## 2022-12-23 DIAGNOSIS — L570 Actinic keratosis: Secondary | ICD-10-CM | POA: Diagnosis not present

## 2022-12-28 IMAGING — DX DG CHEST 2V
2 series · 2 of 2 positions shown · non-contrast
Comparison: X-ray chest 11/11/2017.

CLINICAL DATA: Chronic cough.

EXAM:
CHEST - 2 VIEW

[chest pa]
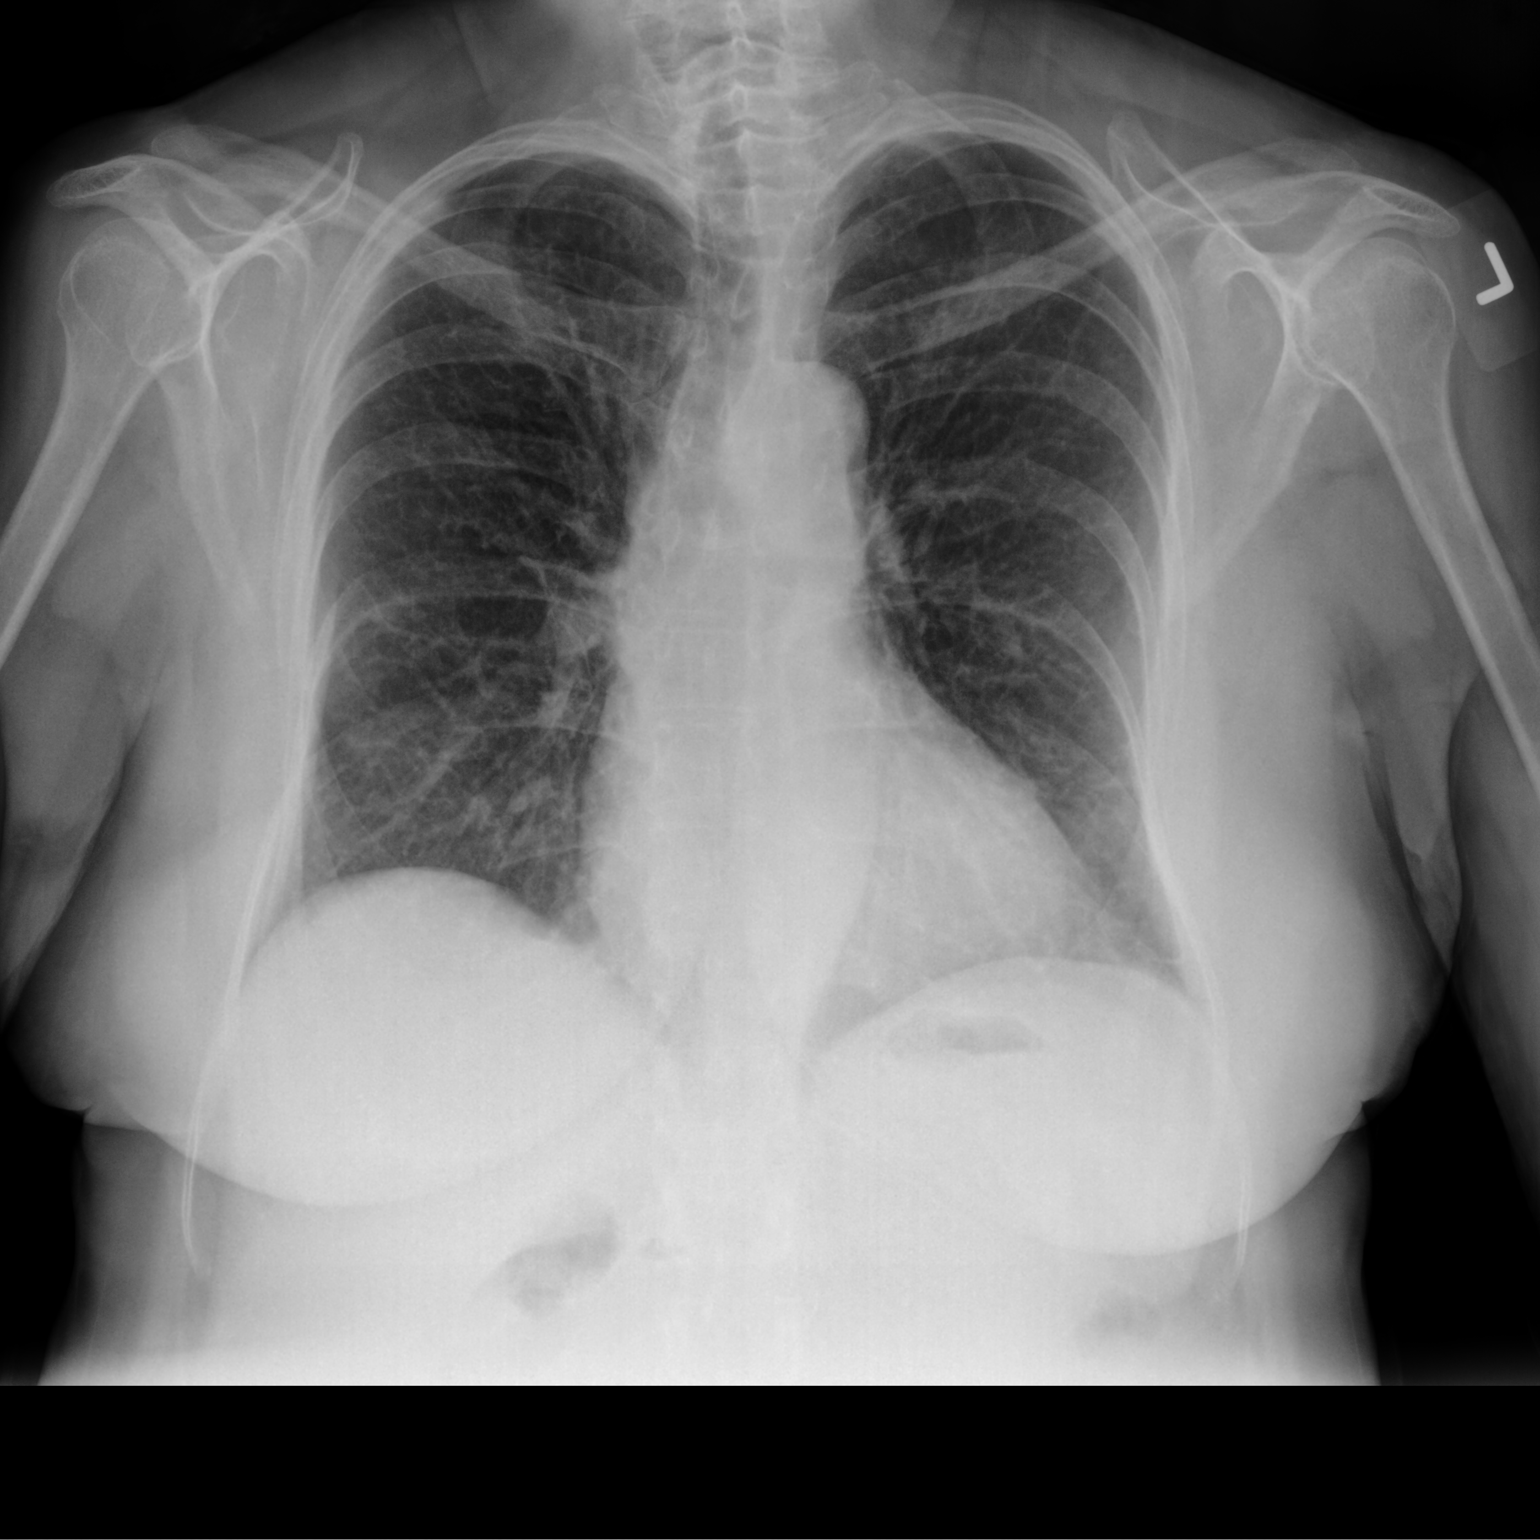

[chest lat]
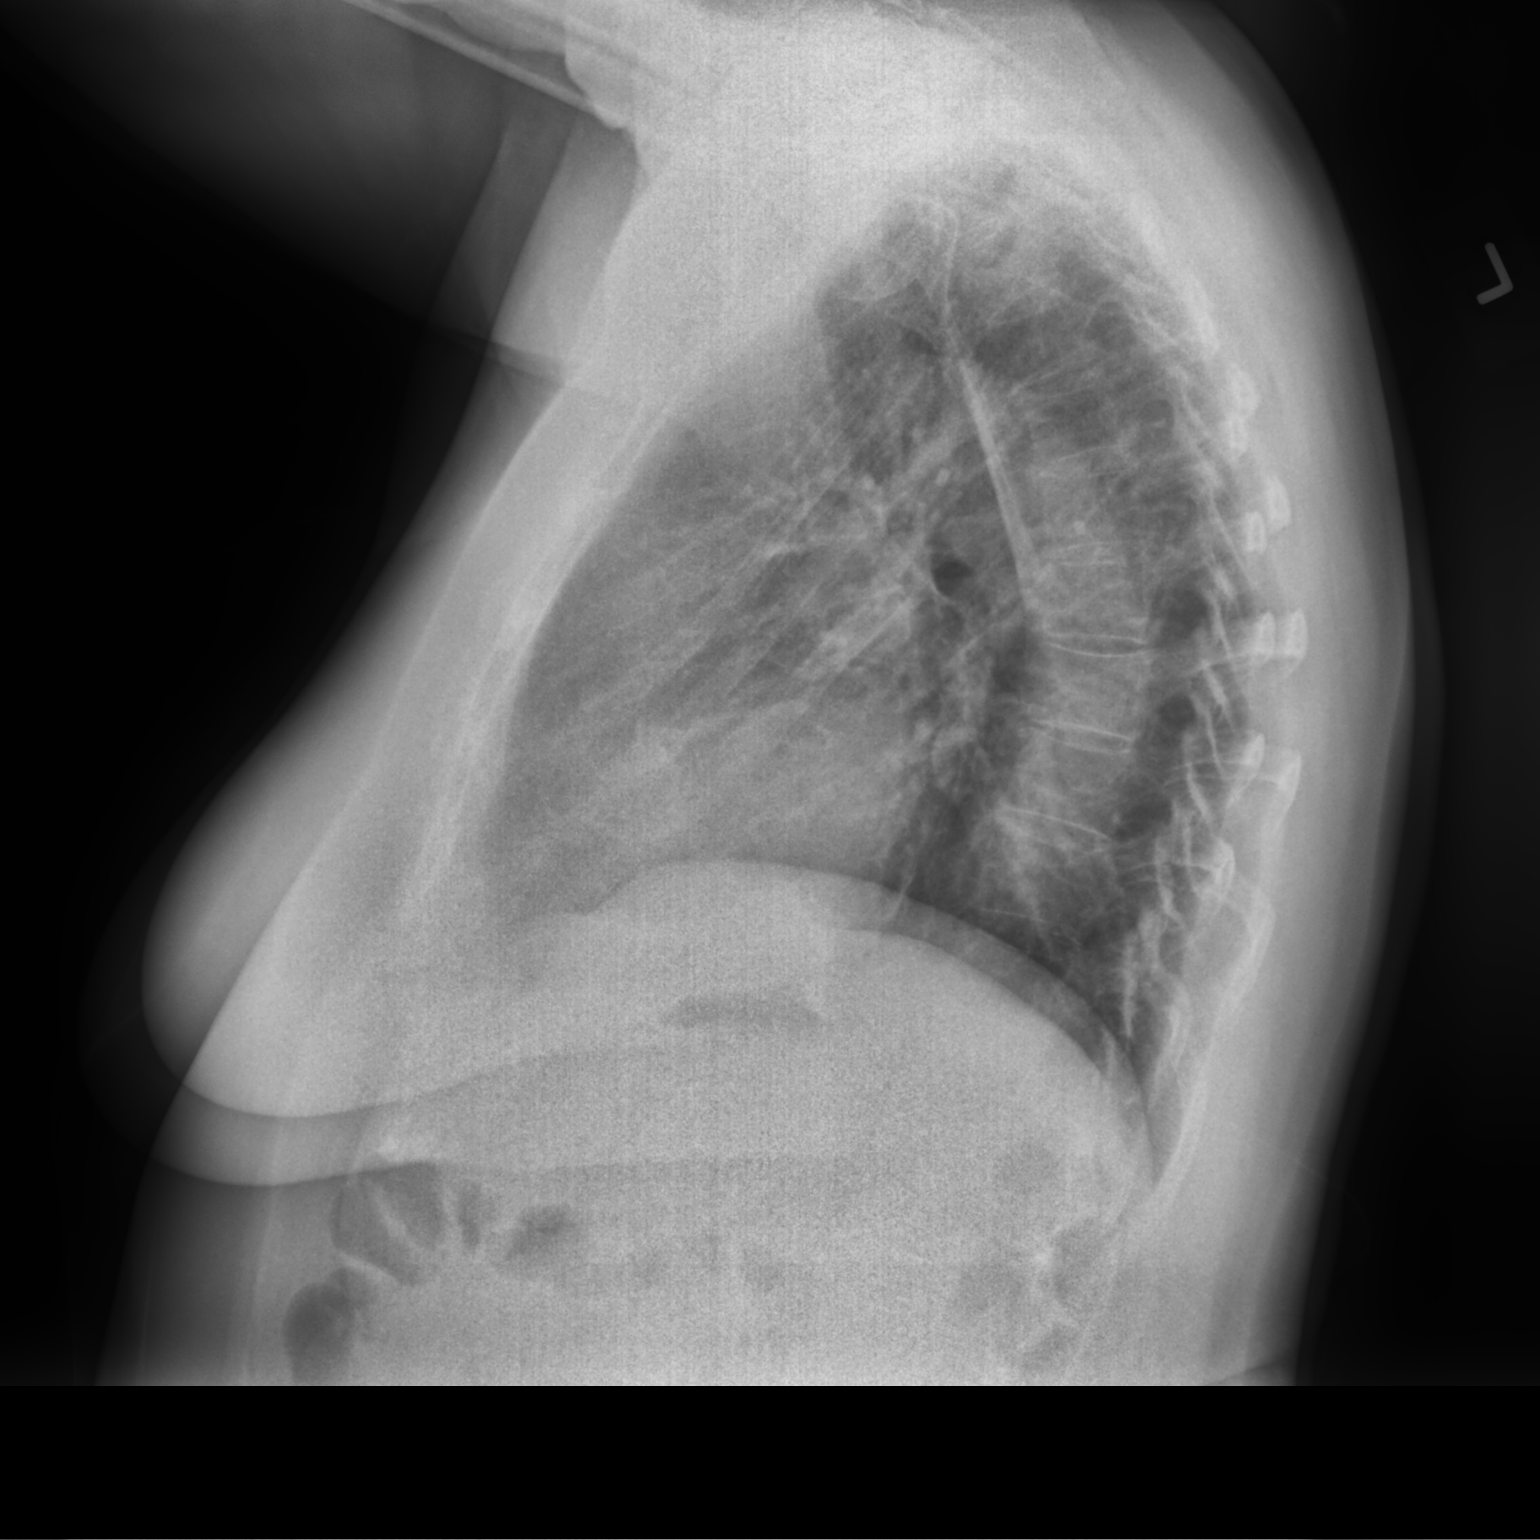

[2 of 2 positions shown; findings below may reference images not displayed]

FINDINGS: The heart size and mediastinal contours are within normal limits. No
focal airspace consolidation, pleural effusion, or pneumothorax. The
visualized skeletal structures are unremarkable.
IMPRESSION: No active cardiopulmonary disease.

## 2023-01-25 DIAGNOSIS — J4540 Moderate persistent asthma, uncomplicated: Secondary | ICD-10-CM | POA: Diagnosis not present

## 2023-01-25 DIAGNOSIS — Z23 Encounter for immunization: Secondary | ICD-10-CM | POA: Diagnosis not present

## 2023-01-25 DIAGNOSIS — E7800 Pure hypercholesterolemia, unspecified: Secondary | ICD-10-CM | POA: Diagnosis not present

## 2023-01-25 DIAGNOSIS — G44219 Episodic tension-type headache, not intractable: Secondary | ICD-10-CM | POA: Diagnosis not present

## 2023-01-25 DIAGNOSIS — I10 Essential (primary) hypertension: Secondary | ICD-10-CM | POA: Diagnosis not present

## 2023-03-23 DIAGNOSIS — L570 Actinic keratosis: Secondary | ICD-10-CM | POA: Diagnosis not present

## 2023-03-23 DIAGNOSIS — L281 Prurigo nodularis: Secondary | ICD-10-CM | POA: Diagnosis not present

## 2023-03-23 DIAGNOSIS — Z85828 Personal history of other malignant neoplasm of skin: Secondary | ICD-10-CM | POA: Diagnosis not present

## 2023-08-02 ENCOUNTER — Other Ambulatory Visit: Payer: Self-pay | Admitting: Physician Assistant

## 2023-08-02 ENCOUNTER — Encounter: Payer: Self-pay | Admitting: Physician Assistant

## 2023-08-02 DIAGNOSIS — M8580 Other specified disorders of bone density and structure, unspecified site: Secondary | ICD-10-CM

## 2023-08-04 ENCOUNTER — Other Ambulatory Visit (HOSPITAL_COMMUNITY): Payer: Self-pay | Admitting: Physician Assistant

## 2023-08-04 DIAGNOSIS — E7800 Pure hypercholesterolemia, unspecified: Secondary | ICD-10-CM

## 2023-08-17 ENCOUNTER — Ambulatory Visit (HOSPITAL_COMMUNITY)
Admission: RE | Admit: 2023-08-17 | Discharge: 2023-08-17 | Disposition: A | Discharge status: Home / Self Care | Payer: Self-pay | Source: Ambulatory Visit | Attending: Physician Assistant | Admitting: Physician Assistant

## 2023-08-17 DIAGNOSIS — E7800 Pure hypercholesterolemia, unspecified: Secondary | ICD-10-CM | POA: Insufficient documentation

## 2023-11-30 DIAGNOSIS — E7800 Pure hypercholesterolemia, unspecified: Secondary | ICD-10-CM | POA: Diagnosis not present

## 2023-11-30 DIAGNOSIS — I10 Essential (primary) hypertension: Secondary | ICD-10-CM | POA: Diagnosis not present

## 2023-12-19 ENCOUNTER — Encounter: Payer: Self-pay | Admitting: Pulmonary Disease

## 2024-02-02 DIAGNOSIS — E7800 Pure hypercholesterolemia, unspecified: Secondary | ICD-10-CM | POA: Diagnosis not present

## 2024-02-02 DIAGNOSIS — I10 Essential (primary) hypertension: Secondary | ICD-10-CM | POA: Diagnosis not present

## 2024-02-28 DIAGNOSIS — M19072 Primary osteoarthritis, left ankle and foot: Secondary | ICD-10-CM | POA: Diagnosis not present

## 2024-03-07 DIAGNOSIS — L738 Other specified follicular disorders: Secondary | ICD-10-CM | POA: Diagnosis not present

## 2024-03-07 DIAGNOSIS — L281 Prurigo nodularis: Secondary | ICD-10-CM | POA: Diagnosis not present

## 2024-03-07 DIAGNOSIS — L570 Actinic keratosis: Secondary | ICD-10-CM | POA: Diagnosis not present

## 2024-03-07 DIAGNOSIS — L821 Other seborrheic keratosis: Secondary | ICD-10-CM | POA: Diagnosis not present

## 2024-03-07 DIAGNOSIS — Z85828 Personal history of other malignant neoplasm of skin: Secondary | ICD-10-CM | POA: Diagnosis not present

## 2024-03-15 ENCOUNTER — Other Ambulatory Visit: Payer: Medicare Other

## 2024-04-17 DIAGNOSIS — L821 Other seborrheic keratosis: Secondary | ICD-10-CM | POA: Diagnosis not present

## 2024-04-17 DIAGNOSIS — L603 Nail dystrophy: Secondary | ICD-10-CM | POA: Diagnosis not present

## 2024-04-17 DIAGNOSIS — Z85828 Personal history of other malignant neoplasm of skin: Secondary | ICD-10-CM | POA: Diagnosis not present

## 2024-05-01 ENCOUNTER — Other Ambulatory Visit: Payer: Self-pay | Admitting: Pulmonary Disease

## 2024-05-01 DIAGNOSIS — J4540 Moderate persistent asthma, uncomplicated: Secondary | ICD-10-CM

## 2024-05-31 ENCOUNTER — Ambulatory Visit: Admitting: Podiatry

## 2024-05-31 ENCOUNTER — Encounter: Payer: Self-pay | Admitting: Podiatry

## 2024-05-31 VITALS — Ht 67.0 in | Wt 178.2 lb

## 2024-05-31 DIAGNOSIS — B351 Tinea unguium: Secondary | ICD-10-CM

## 2024-05-31 MED ORDER — CICLOPIROX 8 % EX SOLN: Freq: Every day | CUTANEOUS | 2 refills | Status: AC

## 2024-05-31 MED ORDER — TERBINAFINE HCL 250 MG PO TABS: 250.0000 mg | ORAL_TABLET | Freq: Every day | ORAL | 0 refills | Status: DC

## 2024-05-31 NOTE — Progress Notes (Signed)
   Chief Complaint  Patient presents with   Nail Problem    Pt is here due to bilateral great toenails, she states the nail are very brittle and breaks off short, thinks she may have a fungus.    Subjective: 73 y.o. female presenting today as a new patient for evaluation of brittle thickened discolored toenails especially to the bilateral great toenails.  She states that her dermatologist provided her samples of Jublia but she has run out.  She will use them for about 6 weeks with minimal improvement.  Past Medical History:  Diagnosis Date   DJD (degenerative joint disease)    RIGHT ANKLE   Elevated blood pressure reading     Past Surgical History:  Procedure Laterality Date   ANKLE BONE SPUR Right    CESAREAN SECTION     x2    Allergies  Allergen Reactions   Penicillins Hives and Rash    Has patient had a PCN reaction causing immediate rash, facial/tongue/throat swelling, SOB or lightheadedness with hypotension: Yes Has patient had a PCN reaction causing severe rash involving mucus membranes or skin necrosis: Yes Has patient had a PCN reaction that required hospitalization: No Has patient had a PCN reaction occurring within the last 10 years: No If all of the above answers are NO, then may proceed with Cephalosporin use.    Omeprazole  Diarrhea   Amoxicillin Hives and Rash    B/L toenails 05/31/2024  Objective: Physical Exam General: The patient is alert and oriented x3 in no acute distress.  Dermatology: Hyperkeratotic, discolored, thickened, onychodystrophy noted. Skin is warm, dry and supple bilateral lower extremities. Negative for open lesions or macerations.  Vascular: Palpable pedal pulses bilaterally. No edema or erythema noted. Capillary refill within normal limits.  Neurological: Grossly intact via light touch  Musculoskeletal Exam: No pedal deformity noted  Assessment: #1 Onychomycosis of toenails bilateral great toes  Plan of Care:  -Patient was  evaluated. -Today we discussed different treatment options including oral, topical, and laser antifungal treatment modalities.  We discussed their efficacies and side effects.  Patient opts for oral antifungal treatment modality in combination with topical antifungal -Prescription for Lamisil  250 mg #90 daily. Pt denies a history of liver pathology or symptoms.  Patient is otherwise healthy -Return to clinic 6 months   Thresa EMERSON Sar, DPM [facility]  Dr. Thresa EMERSON Sar, DPM    [address] [place], [place] [address]                Office [phone]  Fax [phone]

## 2024-08-11 DIAGNOSIS — J3089 Other allergic rhinitis: Secondary | ICD-10-CM | POA: Diagnosis not present

## 2024-08-11 DIAGNOSIS — E7800 Pure hypercholesterolemia, unspecified: Secondary | ICD-10-CM | POA: Diagnosis not present

## 2024-08-11 DIAGNOSIS — Z0000 Encounter for general adult medical examination without abnormal findings: Secondary | ICD-10-CM | POA: Diagnosis not present

## 2024-08-11 DIAGNOSIS — K219 Gastro-esophageal reflux disease without esophagitis: Secondary | ICD-10-CM | POA: Diagnosis not present

## 2024-08-11 DIAGNOSIS — Z23 Encounter for immunization: Secondary | ICD-10-CM | POA: Diagnosis not present

## 2024-08-11 DIAGNOSIS — I10 Essential (primary) hypertension: Secondary | ICD-10-CM | POA: Diagnosis not present

## 2024-08-11 DIAGNOSIS — M8589 Other specified disorders of bone density and structure, multiple sites: Secondary | ICD-10-CM | POA: Diagnosis not present

## 2024-08-11 DIAGNOSIS — J4540 Moderate persistent asthma, uncomplicated: Secondary | ICD-10-CM | POA: Diagnosis not present

## 2024-08-23 ENCOUNTER — Ambulatory Visit: Admitting: Podiatry

## 2024-08-23 ENCOUNTER — Encounter: Payer: Self-pay | Admitting: Podiatry

## 2024-08-23 VITALS — Ht 67.0 in | Wt 178.2 lb

## 2024-08-23 DIAGNOSIS — B351 Tinea unguium: Secondary | ICD-10-CM

## 2024-08-23 DIAGNOSIS — Z79899 Other long term (current) drug therapy: Secondary | ICD-10-CM

## 2024-08-23 MED ORDER — TERBINAFINE HCL 250 MG PO TABS: 250.0000 mg | ORAL_TABLET | Freq: Every day | ORAL | 0 refills | Status: AC

## 2024-08-23 NOTE — Progress Notes (Signed)
   Chief Complaint  Patient presents with   Nail Problem    Pt is here to f/u on toenails due to fungal infection states she will need a refill on the medication soon.    Subjective: 73 y.o. female presenting today for follow-up evaluation of brittle thickened discolored toenails especially to the bilateral great toenails.  She states that her dermatologist provided her samples of Jublia but she has run out.  She will use them for about 6 weeks with minimal improvement.  Past Medical History:  Diagnosis Date   DJD (degenerative joint disease)    RIGHT ANKLE   Elevated blood pressure reading     Past Surgical History:  Procedure Laterality Date   ANKLE BONE SPUR Right    CESAREAN SECTION     x2    Allergies  Allergen Reactions   Penicillins Hives and Rash    Has patient had a PCN reaction causing immediate rash, facial/tongue/throat swelling, SOB or lightheadedness with hypotension: Yes Has patient had a PCN reaction causing severe rash involving mucus membranes or skin necrosis: Yes Has patient had a PCN reaction that required hospitalization: No Has patient had a PCN reaction occurring within the last 10 years: No If all of the above answers are NO, then may proceed with Cephalosporin use.    Omeprazole  Diarrhea   Amoxicillin Hives and Rash    B/L toenails 05/31/2024  Objective: Physical Exam General: The patient is alert and oriented x3 in no acute distress.  Dermatology: Overall improvement but there continues to be hyperkeratotic, discolored, thickened, onychodystrophy noted. Skin is warm, dry and supple bilateral lower extremities. Negative for open lesions or macerations.  Vascular: Palpable pedal pulses bilaterally. No edema or erythema noted. Capillary refill within normal limits.  Neurological: Grossly intact via light touch  Musculoskeletal Exam: No pedal deformity noted  Assessment: #1 Onychomycosis of toenails bilateral great toes  Plan of Care:   -Patient was evaluated. - Refill prescription for Lamisil  250 mg #90 daily. Pt denies a history of liver pathology or symptoms.  Patient reports that she had recent blood work performed at her PCP office which was WNL -Allow the nails to continue to grow out.  After this round of Lamisil  we will discontinue and simply observe -Return to clinic PRN   Thresa EMERSON Sar, DPM [facility]  Dr. Thresa EMERSON Sar, DPM    [address] [place], [place] [address]                Office [phone]  Fax [phone]
